# Patient Record
Sex: Male | Born: 1970
Health system: Southern US, Community
[De-identification: ages and names within clinical notes are randomized; demographics above are authoritative.]

## PROBLEM LIST (undated history)

## (undated) DIAGNOSIS — M25562 Pain in left knee: Secondary | ICD-10-CM

## (undated) DIAGNOSIS — Z86010 Personal history of colonic polyps: Secondary | ICD-10-CM

## (undated) DIAGNOSIS — E66812 Obesity, class 2: Secondary | ICD-10-CM

## (undated) DIAGNOSIS — T83420A Displacement of penile (implanted) prosthesis, initial encounter: Secondary | ICD-10-CM

## (undated) DIAGNOSIS — G4700 Insomnia, unspecified: Secondary | ICD-10-CM

## (undated) DIAGNOSIS — E669 Obesity, unspecified: Secondary | ICD-10-CM

## (undated) DIAGNOSIS — N200 Calculus of kidney: Secondary | ICD-10-CM

## (undated) DIAGNOSIS — K219 Gastro-esophageal reflux disease without esophagitis: Secondary | ICD-10-CM

## (undated) DIAGNOSIS — F17201 Nicotine dependence, unspecified, in remission: Secondary | ICD-10-CM

## (undated) HISTORY — DX: Obesity, unspecified: E66.9

## (undated) HISTORY — DX: Insomnia, unspecified: G47.00

## (undated) HISTORY — DX: Obesity, class 2: E66.812

## (undated) HISTORY — DX: Gastro-esophageal reflux disease without esophagitis: K21.9

## (undated) HISTORY — PX: CERVICAL FUSION: SHX112

## (undated) HISTORY — DX: Calculus of kidney: N20.0

## (undated) HISTORY — DX: Pain in left knee: M25.562

## (undated) HISTORY — DX: Nicotine dependence, unspecified, in remission: F17.201

---
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
## 1999-01-22 HISTORY — PX: RECONSTRUCTION MID-FACE: SUR1085

## 2010-01-21 HISTORY — PX: ESOPHAGOGASTRODUODENOSCOPY: SHX1529

## 2012-12-01 ENCOUNTER — Ambulatory Visit (INDEPENDENT_AMBULATORY_CARE_PROVIDER_SITE_OTHER): Payer: BC Managed Care – PPO | Admitting: Nurse Practitioner

## 2012-12-01 ENCOUNTER — Encounter: Payer: Self-pay | Admitting: Nurse Practitioner

## 2012-12-01 VITALS — BP 122/82 | HR 82 | Temp 98.1°F | Resp 12 | Wt 214.8 lb

## 2012-12-01 DIAGNOSIS — J069 Acute upper respiratory infection, unspecified: Secondary | ICD-10-CM

## 2012-12-01 DIAGNOSIS — K219 Gastro-esophageal reflux disease without esophagitis: Secondary | ICD-10-CM | POA: Insufficient documentation

## 2012-12-01 NOTE — Patient Instructions (Signed)
You have a cold that is causing nasal congestion, post-nasal drip & cough. Use Neilmed Sinus Rinse daily to clear sinuses. You should be feeling better in another week.  Upper Respiratory Infection, Adult An upper respiratory infection (URI) is also known as the common cold. It is often caused by a type of germ (virus). Colds are easily spread (contagious). You can pass it to others by kissing, coughing, sneezing, or drinking out of the same glass. Usually, you get better in 1 or 2 weeks.  HOME CARE   Only take medicine as told by your doctor.  Use a warm mist humidifier or breathe in steam from a hot shower.  Drink enough water and fluids to keep your pee (urine) clear or pale yellow.  Get plenty of rest.  Return to work when your temperature is back to normal or as told by your doctor. You may use a face mask and wash your hands to stop your cold from spreading. GET HELP RIGHT AWAY IF:   After the first few days, you feel you are getting worse.  You have questions about your medicine.  You have chills, shortness of breath, or brown or red spit (mucus).  You have yellow or brown snot (nasal discharge) or pain in the face, especially when you bend forward.  You have a fever, puffy (swollen) neck, pain when you swallow, or white spots in the back of your throat.  You have a bad headache, ear pain, sinus pain, or chest pain.  You have a high-pitched whistling sound when you breathe in and out (wheezing).  You have a lasting cough or cough up blood.  You have sore muscles or a stiff neck. MAKE SURE YOU:   Understand these instructions.  Will watch your condition.  Will get help right away if you are not doing well or get worse. Document Released: 06/26/2007 Document Revised: 04/01/2011 Document Reviewed: 05/14/2010 [facility] Patient Information 2014 [place], [place].
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
## 2012-12-10 ENCOUNTER — Encounter: Payer: BC Managed Care – PPO | Admitting: Family Medicine

## 2012-12-11 ENCOUNTER — Encounter: Payer: Self-pay | Admitting: Family Medicine

## 2012-12-11 ENCOUNTER — Other Ambulatory Visit: Payer: Self-pay | Admitting: Family Medicine

## 2012-12-11 ENCOUNTER — Ambulatory Visit (INDEPENDENT_AMBULATORY_CARE_PROVIDER_SITE_OTHER): Payer: BC Managed Care – PPO | Admitting: Family Medicine

## 2012-12-11 VITALS — BP 120/83 | HR 77 | Temp 98.5°F | Resp 18 | Ht 73.0 in | Wt 240.0 lb

## 2012-12-11 DIAGNOSIS — G4700 Insomnia, unspecified: Secondary | ICD-10-CM

## 2012-12-11 DIAGNOSIS — Z0000 Encounter for general adult medical examination without abnormal findings: Secondary | ICD-10-CM

## 2012-12-11 DIAGNOSIS — K219 Gastro-esophageal reflux disease without esophagitis: Secondary | ICD-10-CM

## 2012-12-11 DIAGNOSIS — J209 Acute bronchitis, unspecified: Secondary | ICD-10-CM

## 2012-12-11 DIAGNOSIS — J019 Acute sinusitis, unspecified: Secondary | ICD-10-CM

## 2012-12-11 LAB — CBC WITH DIFFERENTIAL/PLATELET
Basophils Absolute: 0 10*3/uL (ref 0.0–0.1)
Hemoglobin: 14.4 g/dL (ref 13.0–17.0)
Lymphocytes Relative: 28 % (ref 12–46)
Lymphs Abs: 1.9 10*3/uL (ref 0.7–4.0)
Monocytes Relative: 8 % (ref 3–12)
Neutro Abs: 4.2 10*3/uL (ref 1.7–7.7)
Neutrophils Relative %: 63 % (ref 43–77)
Platelets: 301 10*3/uL (ref 150–400)
RDW: 13.7 % (ref 11.5–15.5)
WBC: 6.7 10*3/uL (ref 4.0–10.5)

## 2012-12-11 LAB — LIPID PANEL
HDL: 28 mg/dL — ABNORMAL LOW (ref >39)
LDL Cholesterol: 90 mg/dL (ref 0–99)
Total CHOL/HDL Ratio: 5.5 Ratio

## 2012-12-11 LAB — COMPREHENSIVE METABOLIC PANEL
ALT: 22 U/L (ref 0–53)
AST: 17 U/L (ref 0–37)
Albumin: 4.3 g/dL (ref 3.5–5.2)
CO2: 30 mEq/L (ref 19–32)
Calcium: 9.3 mg/dL (ref 8.4–10.5)
Chloride: 102 mEq/L (ref 96–112)
Creat: 1.21 mg/dL (ref 0.50–1.35)
Potassium: 4.6 mEq/L (ref 3.5–5.3)
Sodium: 139 mEq/L (ref 135–145)
Total Protein: 7.2 g/dL (ref 6.0–8.3)

## 2012-12-11 LAB — TSH: TSH: 3.073 u[IU]/mL (ref 0.350–4.500)

## 2012-12-11 MED ORDER — AZITHROMYCIN 250 MG PO TABS: ORAL_TABLET | ORAL | Status: DC

## 2012-12-11 MED ORDER — TRAZODONE HCL 100 MG PO TABS: ORAL_TABLET | ORAL | Status: DC

## 2012-12-11 NOTE — Assessment & Plan Note (Signed)
Prolonged URI with mild bronchitis.  No sign of RAD. Will rx azithromycin x 5d.
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
## 2012-12-11 NOTE — Assessment & Plan Note (Signed)
Trial of trazodone 100mg , 1 tab qhs.  May increase to 200 mg qhs after a few days if needed. I recommended he stop his xanax that he's been taking.

## 2012-12-11 NOTE — Assessment & Plan Note (Signed)
He'll continue omeprazole 40mg  bid. He'll make routine f/u with his GI MD over the next couple of months.

## 2012-12-11 NOTE — Assessment & Plan Note (Signed)
Reviewed age and gender appropriate health maintenance issues (prudent diet, regular exercise, health risks of tobacco and excessive alcohol, use of seatbelts, fire alarms in home, use of sunscreen).  Also reviewed age and gender appropriate health screening as well as vaccine recommendations. He declined flu vaccine.   HP labs ordered today (med center [facility]). Discussed general TLC changes to try to lose 25 lbs over the next 1 year.

## 2012-12-30 ENCOUNTER — Encounter: Payer: Self-pay | Admitting: Family Medicine

## 2012-12-30 ENCOUNTER — Ambulatory Visit (INDEPENDENT_AMBULATORY_CARE_PROVIDER_SITE_OTHER): Payer: BC Managed Care – PPO | Admitting: Family Medicine

## 2012-12-30 VITALS — BP 128/88 | HR 83 | Temp 98.7°F | Resp 18 | Ht 73.0 in | Wt 240.0 lb

## 2012-12-30 DIAGNOSIS — G4700 Insomnia, unspecified: Secondary | ICD-10-CM

## 2012-12-30 DIAGNOSIS — J309 Allergic rhinitis, unspecified: Secondary | ICD-10-CM

## 2012-12-30 DIAGNOSIS — J300 Vasomotor rhinitis: Secondary | ICD-10-CM | POA: Insufficient documentation

## 2012-12-30 MED ORDER — FLUTICASONE PROPIONATE 50 MCG/ACT NA SUSP: NASAL | Status: DC

## 2012-12-30 MED ORDER — TEMAZEPAM 15 MG PO CAPS: ORAL_CAPSULE | ORAL | Status: DC

## 2012-12-30 NOTE — Assessment & Plan Note (Signed)
I'll try flonase at this time and he needs to take it every day. If this doesn't help then I'll try astepro. No sign of infection.

## 2012-12-30 NOTE — Progress Notes (Signed)
Pre visit review using our clinic review tool, if applicable. No additional management support is needed unless otherwise documented below in the visit note. 

## 2012-12-30 NOTE — Assessment & Plan Note (Signed)
Discussed sleep hygiene in depth today and strongly encouraged pt to start paying attention to this. He'll d/c any current sedative hypnotic sleep aids. I'll start restoril 15mg , 1-2 po qhs prn insomnia, #60, RF x 1. Therapeutic expectations and side effect profile of medication discussed today.  Patient's questions answered.

## 2012-12-30 NOTE — Progress Notes (Signed)
OFFICE NOTE  12/30/2012  CC:  Chief Complaint  Patient presents with  . Nasal Congestion    going on two months  . Insomnia     HPI: Patient is a 42 y.o. Caucasian male who is here for ongoing nasal/sinus congestion in night and mornings.  No coughing.  Occ PND but not much.  No nasal sprays.  No fevers.  NO face pain or upper teeth pain.  During the daytime he says he is fine.  No wheezing or SOB or chest tightness.  Z-pack in recent past no help.  Taking claritin D lately.  Sleeping problems: "I got 2 hours last night".  Onset of problem initiating and maintaining sleep approx 8-12 mo ago at least.  Denies mind racing.  No restlessness in legs.  Describes poor sleep hygiene.  Tosses and turns a lot.  Trazodone 100mg  no help.  Ambien in the past helped but was giving him nightmares so he stopped it.  He has admitted to "getting" xanax (not from an MD) and trying it but it hasn't done much to help either.  Pertinent PMH:  Insomnia GERD  MEDS:  Omeprazole 40mg  qd  PE: Blood pressure 128/88, pulse 83, temperature 98.7 F (37.1 C), temperature source Temporal, resp. rate 18, height 6\' 1"  (1.854 m), weight 240 lb (108.863 kg), SpO2 96.00%. Gen: Alert, well appearing.  Patient is oriented to person, place, time, and situation. ZOX:WRUE: no injection, icteris, swelling, or exudate.  EOMI, PERRLA. Mouth: lips without lesion/swelling.  Oral mucosa pink and moist. Oropharynx without erythema, exudate, or swelling.  Neck - No masses or thyromegaly or limitation in range of motion CV: RRR, no m/r/g.   LUNGS: CTA bilat, nonlabored resps, good aeration in all lung fields.   IMPRESSION AND PLAN:  Insomnia Discussed sleep hygiene in depth today and strongly encouraged pt to start paying attention to this. He'll d/c any current sedative hypnotic sleep aids. I'll start restoril 15mg , 1-2 po qhs prn insomnia, #60, RF x 1. Therapeutic expectations and side effect profile of medication  discussed today.  Patient's questions answered.   Vasomotor rhinitis I'll try flonase at this time and he needs to take it every day. If this doesn't help then I'll try astepro. No sign of infection.  I recommended he d/c claritin D.  An After Visit Summary was printed and given to the patient.  FOLLOW UP: 1 mo

## 2013-01-15 ENCOUNTER — Ambulatory Visit: Payer: BC Managed Care – PPO | Admitting: Family Medicine

## 2013-01-27 ENCOUNTER — Ambulatory Visit: Payer: BC Managed Care – PPO | Admitting: Family Medicine

## 2013-02-04 ENCOUNTER — Ambulatory Visit (INDEPENDENT_AMBULATORY_CARE_PROVIDER_SITE_OTHER): Payer: BC Managed Care – PPO | Admitting: Family Medicine

## 2013-02-04 ENCOUNTER — Encounter: Payer: Self-pay | Admitting: Family Medicine

## 2013-02-04 VITALS — BP 135/94 | HR 77 | Temp 98.7°F | Resp 18 | Ht 73.0 in | Wt 242.0 lb

## 2013-02-04 DIAGNOSIS — J309 Allergic rhinitis, unspecified: Secondary | ICD-10-CM

## 2013-02-04 DIAGNOSIS — G4700 Insomnia, unspecified: Secondary | ICD-10-CM

## 2013-02-04 DIAGNOSIS — Z23 Encounter for immunization: Secondary | ICD-10-CM

## 2013-02-04 DIAGNOSIS — J300 Vasomotor rhinitis: Secondary | ICD-10-CM

## 2013-02-04 MED ORDER — TEMAZEPAM 15 MG PO CAPS: ORAL_CAPSULE | ORAL | Status: DC

## 2013-02-04 NOTE — Progress Notes (Signed)
OFFICE NOTE  02/04/2013  CC:  Chief Complaint  Patient presents with  . Follow-up     HPI: Patient is a 43 y.o. Caucasian male who is here for 1 mo f/u insomnia and nasal congestion. Nasal congestion cleared up with about 1 wk use of flonase that I rx'd last visit.  His sleep initiation and maintenance is much improved on the restoril.  He takes 2 of the 15mg  tabs every night and also 2 aleve PM every night.  Also his not having anymore of the bad dreams like before.  No new complaints. Pertinent PMH:  Past Medical History  Diagnosis Date  . GERD (gastroesophageal reflux disease)     [facility] [place]  . Tobacco dependence in remission     Quit 2005  . Insomnia   . Nephrolithiasis     remote past (no procedure has been required to remove stone)    MEDS:  Outpatient Prescriptions Prior to Visit  Medication Sig Dispense Refill  . omeprazole (PRILOSEC) 40 MG capsule Take 40 mg by mouth 2 (two) times daily.      Marland Kitchen. terbinafine (LAMISIL) 250 MG tablet Take 250 mg by mouth daily.      . temazepam (RESTORIL) 15 MG capsule 1-2 caps po qhs prn insomnia  60 capsule  1  . fluticasone (FLONASE) 50 MCG/ACT nasal spray 2 sprays in each nostril once every morning  16 g  6  . traZODone (DESYREL) 100 MG tablet 1-2 tabs po qhs prn insomnia  60 tablet  1   No facility-administered medications prior to visit.    PE: Blood pressure 135/94, pulse 77, temperature 98.7 F (37.1 C), temperature source Temporal, resp. rate 18, height 6\' 1"  (1.854 m), weight 242 lb (109.77 kg), SpO2 96.00%. Gen: Alert, well appearing.  Patient is oriented to person, place, time, and situation. AFFECT: pleasant, lucid thought and speech. No further exam today.  IMPRESSION AND PLAN:  Insomnia The current medical regimen is effective;  continue present plan and medications. Rx for restoril 15mg , #60, RF x 5. Controlled substance contract reviewed with patient today.  Patient signed this and it  will be placed in the chart.   No UDS was done today.   Vasomotor rhinitis This appears to have cleared up nicely with just a week or so of flonase. I suppose this was more inflammatory than vasomotor.   Preventative health: gave Tdap today. He declined flu vaccine.  An After Visit Summary was printed and given to the patient.  FOLLOW UP: 18mo

## 2013-02-04 NOTE — Assessment & Plan Note (Signed)
This appears to have cleared up nicely with just a week or so of flonase. I suppose this was more inflammatory than vasomotor.

## 2013-02-04 NOTE — Assessment & Plan Note (Signed)
The current medical regimen is effective;  continue present plan and medications. Rx for restoril 15mg , #60, RF x 5. Controlled substance contract reviewed with patient today.  Patient signed this and it will be placed in the chart.   No UDS was done today.

## 2013-02-16 ENCOUNTER — Telehealth: Payer: Self-pay | Admitting: Family Medicine

## 2013-02-17 MED ORDER — OMEPRAZOLE 40 MG PO CPDR: 40.0000 mg | DELAYED_RELEASE_CAPSULE | Freq: Two times a day (BID) | ORAL | Status: DC

## 2013-02-17 NOTE — Telephone Encounter (Signed)
Omeprazole sent to pharmacy per [facility] protocol and patient request.

## 2013-06-10 ENCOUNTER — Other Ambulatory Visit: Payer: Self-pay | Admitting: Family Medicine

## 2013-08-16 ENCOUNTER — Other Ambulatory Visit: Payer: Self-pay | Admitting: Family Medicine

## 2013-08-16 NOTE — Telephone Encounter (Signed)
Rx refill request for temazepam.  Patient last seen 02/04/13. Last Rx filled 02/04/13 x 5 rfs.  Please advise.

## 2013-09-01 ENCOUNTER — Ambulatory Visit: Payer: BC Managed Care – PPO | Admitting: Family Medicine

## 2013-09-08 ENCOUNTER — Other Ambulatory Visit: Payer: Self-pay | Admitting: Hematology and Oncology

## 2013-10-11 ENCOUNTER — Other Ambulatory Visit: Payer: Self-pay | Admitting: Family Medicine

## 2013-10-15 ENCOUNTER — Ambulatory Visit (INDEPENDENT_AMBULATORY_CARE_PROVIDER_SITE_OTHER): Payer: BC Managed Care – PPO | Admitting: Family Medicine

## 2013-10-15 ENCOUNTER — Encounter: Payer: Self-pay | Admitting: Family Medicine

## 2013-10-15 VITALS — BP 132/84 | HR 66 | Temp 97.7°F | Resp 18 | Ht 73.0 in | Wt 239.0 lb

## 2013-10-15 DIAGNOSIS — K219 Gastro-esophageal reflux disease without esophagitis: Secondary | ICD-10-CM

## 2013-10-15 DIAGNOSIS — G4700 Insomnia, unspecified: Secondary | ICD-10-CM

## 2013-10-15 MED ORDER — OMEPRAZOLE 40 MG PO CPDR: DELAYED_RELEASE_CAPSULE | ORAL | Status: DC

## 2013-10-15 NOTE — Progress Notes (Signed)
Pre visit review using our clinic review tool, if applicable. No additional management support is needed unless otherwise documented below in the visit note. 

## 2013-10-15 NOTE — Progress Notes (Signed)
OFFICE NOTE  10/15/2013  CC:  Chief Complaint  Patient presents with  . Follow-up   HPI: Patient is a 43 y.o. Caucasian male who is here for 9 mo f/u insomnia.  Must take prilosec  bid in order to keep GERD sx's away.  He does not avoid GER-inducing food but has to avoid alcohol.    Takes restoril  2 tabs qhs for insomnia every night.  Pertinent PMH:  Past medical, surgical, social, and family history reviewed and no changes are noted since last office visit.  MEDS:  Outpatient Prescriptions Prior to Visit  Medication Sig Dispense Refill  . fluticasone (FLONASE) 50 MCG/ACT nasal spray 2 sprays in each nostril once every morning  16 g  6  . omeprazole (PRILOSEC) 40 MG capsule TAKE 1 CAPSULE (40 MG) BY MOUTH 2 (TWO) TIMES DAILY.  60 capsule  3  . temazepam (RESTORIL) 15 MG capsule TAKE 1-2 CAPSULES BY MOUTH AT BEDTIME AS NEEDED FOR INSOMNIA  60 capsule  5  . terbinafine (LAMISIL) 250 MG tablet Take 250 mg by mouth daily.       No facility-administered medications prior to visit.    PE: Blood pressure 132/84, pulse 66, temperature 97.7 F (36.5 C), temperature source Oral, resp. rate 18, height  (1.854 m), weight 239 lb (108.41 kg), SpO2 97.00%. Gen: Alert, well appearing.  Patient is oriented to person, place, time, and situation. AFFECT: pleasant, lucid thought and speech. No further exam today.  IMPRESSION AND PLAN:  1) GERD, chronic and severe. Needs bid omeprazole .  Needs another EGD for screening for Barrett's esophagus in a couple of years.  Pt expressed understanding of need for this, also understands potential risks of use of PPI long term.  2) Insomnia: responding well to  restoril qhs.  Continue this.  No new rx needed today.  An After Visit Summary was printed and given to the patient.  Pt declined flu vaccine today.  FOLLOW UP: 6 mo for fasting CPE

## 2013-11-29 ENCOUNTER — Encounter: Payer: Self-pay | Admitting: Nurse Practitioner

## 2013-11-29 ENCOUNTER — Ambulatory Visit (INDEPENDENT_AMBULATORY_CARE_PROVIDER_SITE_OTHER): Payer: BC Managed Care – PPO | Admitting: Nurse Practitioner

## 2013-11-29 VITALS — BP 137/94 | HR 68 | Temp 98.4°F | Resp 18 | Ht 73.0 in | Wt 236.0 lb

## 2013-11-29 DIAGNOSIS — J029 Acute pharyngitis, unspecified: Secondary | ICD-10-CM

## 2013-11-29 DIAGNOSIS — R059 Cough, unspecified: Secondary | ICD-10-CM

## 2013-11-29 DIAGNOSIS — R05 Cough: Secondary | ICD-10-CM

## 2013-11-29 LAB — POCT RAPID STREP A (OFFICE): Rapid Strep A Screen: NEGATIVE

## 2013-11-29 MED ORDER — BENZONATATE 100 MG PO CAPS: ORAL_CAPSULE | ORAL | Status: DC

## 2013-11-29 NOTE — Patient Instructions (Signed)
This is likely a viral sore throat/upper respiratory infection. However, if your culture comes back growing bacteria, I will call in an antibiotic. In the meantime, you may use benzocaine throat lozenges or throat spray for comfort. Salt water gargles (1/4 cup warm water mixed with 1/4 tsp salt) twice daily & listerene gargles twice daily. If you develop runny nose, start Neilmed sinus rinses daily.  Rest, sip fluids every hour.  Feel better!  Sore Throat A sore throat is pain, burning, irritation, or scratchiness of the throat. There is often pain or tenderness when swallowing or talking. A sore throat may be accompanied by other symptoms, such as coughing, sneezing, fever, and swollen neck glands. A sore throat is often the first sign of another sickness, such as a cold, flu, strep throat, or mononucleosis (commonly known as mono). Most sore throats go away without medical treatment. CAUSES  The most common causes of a sore throat include:  A viral infection, such as a cold, flu, or mono.  A bacterial infection, such as strep throat, tonsillitis, or whooping cough.  Seasonal allergies.  Dryness in the air.  Irritants, such as smoke or pollution.  Gastroesophageal reflux disease (GERD). HOME CARE INSTRUCTIONS   Only take over-the-counter medicines as directed by your caregiver.  Drink enough fluids to keep your urine clear or pale yellow.  Rest as needed.  Try using throat sprays, lozenges, or sucking on hard candy to ease any pain (if older than 4 years or as directed).  Sip warm liquids, such as broth, herbal tea, or warm water with honey to relieve pain temporarily. You may also eat or drink cold or frozen liquids such as frozen ice pops.  Gargle with salt water (mix 1 tsp salt with 8 oz of water).  Do not smoke and avoid secondhand smoke.  Put a cool-mist humidifier in your bedroom at night to moisten the air. You can also turn on a hot shower and sit in the bathroom with the  door closed for 5 10 minutes. SEEK IMMEDIATE MEDICAL CARE IF:  You have difficulty breathing.  You are unable to swallow fluids, soft foods, or your saliva.  You have increased swelling in the throat.  Your sore throat does not get better in 7 days.  You have nausea and vomiting.  You have a fever or persistent symptoms for more than 2 3 days.  You have a fever and your symptoms suddenly get worse. MAKE SURE YOU:   Understand these instructions.  Will watch your condition.  Will get help right away if you are not doing well or get worse. Document Released: 02/15/2004 Document Revised: 12/25/2011 Document Reviewed: 09/15/2011 [employer] Patient Information 2014 [employer]. 

## 2013-11-29 NOTE — Progress Notes (Signed)
   Subjective:    Patient ID: Spencer Harrington, male    DOB: 1970-05-31, 43 y.o.   MRN: [id number]  Cough This is a new problem. The current episode started yesterday. The problem occurs hourly. The cough is non-productive. Associated symptoms include headaches and a sore throat. Pertinent negatives include no chest pain, chills, ear congestion, ear pain, fever, myalgias, nasal congestion, postnasal drip, shortness of breath or wheezing. Nothing aggravates the symptoms. He has tried nothing for the symptoms.      Review of Systems  Constitutional: Negative for fever, chills, activity change, appetite change and fatigue.  HENT: Positive for sore throat. Negative for congestion, ear pain and postnasal drip.   Respiratory: Negative for chest tightness, shortness of breath and wheezing.   Cardiovascular: Negative for chest pain.  Gastrointestinal: Negative for nausea, abdominal pain and diarrhea.  Musculoskeletal: Negative for myalgias and back pain.  Neurological: Positive for headaches.       Objective:   Physical Exam  Constitutional: He is oriented to person, place, and time. He appears well-developed and well-nourished. No distress.  HENT:  Head: Normocephalic and atraumatic.  Right Ear: External ear normal.  Left Ear: External ear normal.  Ceruminosis L TM R TM nml Posterior pharynx erythematous. Tonsillith L tonsil.   Eyes: Conjunctivae are normal. Right eye exhibits no discharge. Left eye exhibits no discharge.  Cardiovascular: Normal rate, regular rhythm and normal heart sounds.   No murmur heard. Pulmonary/Chest: Effort normal and breath sounds normal. No respiratory distress. He has no wheezes. He has no rales.  Neurological: He is alert and oriented to person, place, and time.  Skin: Skin is warm and dry.  Psychiatric: He has a normal mood and affect. His behavior is normal. Thought content normal.  Vitals reviewed.         Assessment & Plan:  1. Sore  throat Likely viral - POCT rapid strep A-neg -benzonatate for cough See patient instructions for complete plan. F/u prn

## 2013-11-29 NOTE — Progress Notes (Signed)
Pre visit review using our clinic review tool, if applicable. No additional management support is needed unless otherwise documented below in the visit note. 

## 2013-12-01 ENCOUNTER — Telehealth: Payer: Self-pay | Admitting: Nurse Practitioner

## 2013-12-01 NOTE — Telephone Encounter (Signed)
pls call pt: Advise Viral sore throat. Continue w/ofc instructions.

## 2013-12-01 NOTE — Telephone Encounter (Signed)
Patient notified of results.

## 2013-12-02 LAB — CULTURE, UPPER RESPIRATORY: Organism ID, Bacteria: NORMAL

## 2014-02-18 ENCOUNTER — Other Ambulatory Visit: Payer: Self-pay | Admitting: Family Medicine

## 2014-02-18 NOTE — Telephone Encounter (Signed)
RF for temazepam.  Last OV was 10/15/13.  Last RX was 08/16/13 x 5 rfs.  Please advise.

## 2014-02-21 ENCOUNTER — Other Ambulatory Visit: Payer: Self-pay | Admitting: Family Medicine

## 2014-02-28 ENCOUNTER — Ambulatory Visit (INDEPENDENT_AMBULATORY_CARE_PROVIDER_SITE_OTHER): Payer: No Typology Code available for payment source | Admitting: Family Medicine

## 2014-02-28 ENCOUNTER — Encounter: Payer: Self-pay | Admitting: Family Medicine

## 2014-02-28 VITALS — BP 159/122 | HR 79 | Temp 98.6°F | Resp 18 | Ht 73.0 in | Wt 238.0 lb

## 2014-02-28 DIAGNOSIS — M5030 Other cervical disc degeneration, unspecified cervical region: Secondary | ICD-10-CM

## 2014-02-28 DIAGNOSIS — M542 Cervicalgia: Secondary | ICD-10-CM

## 2014-02-28 DIAGNOSIS — M62838 Other muscle spasm: Secondary | ICD-10-CM

## 2014-02-28 DIAGNOSIS — M6248 Contracture of muscle, other site: Secondary | ICD-10-CM

## 2014-02-28 MED ORDER — CYCLOBENZAPRINE HCL 10 MG PO TABS: 10.0000 mg | ORAL_TABLET | Freq: Three times a day (TID) | ORAL | Status: DC | PRN

## 2014-02-28 MED ORDER — OXYCODONE-ACETAMINOPHEN 5-325 MG PO TABS: 1.0000 | ORAL_TABLET | Freq: Four times a day (QID) | ORAL | Status: DC | PRN
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
## 2014-02-28 NOTE — Progress Notes (Signed)
Pre visit review using our clinic review tool, if applicable. No additional management support is needed unless otherwise documented below in the visit note. 

## 2014-03-04 ENCOUNTER — Telehealth: Payer: Self-pay | Admitting: Family Medicine

## 2014-03-04 DIAGNOSIS — M542 Cervicalgia: Secondary | ICD-10-CM

## 2014-03-04 NOTE — Telephone Encounter (Signed)
Patient called back, was able to get an appointment with [facility] for Monday 03/07/14. I also found out it he went to the [facility] in [place], not an Urgent Care.

## 2014-03-04 NOTE — Telephone Encounter (Signed)
Please advise referral?  

## 2014-03-04 NOTE — Telephone Encounter (Signed)
Patient was in so much pain he went to Urgent Care. They did MRI & found a bone spur in his neck. They recommended surgery. Patient needs referral, he is not requesting a particular practice. Please put in a referral.

## 2014-03-05 NOTE — Telephone Encounter (Signed)
Referral ordered as per pt request. Pls get his records (or ask Diane to) from [facility] ED in [place]-thx

## 2014-03-07 NOTE — Telephone Encounter (Signed)
Fax sent requesting medical records.

## 2014-03-30 MED ORDER — PANTOPRAZOLE SODIUM 40 MG PO TBEC: 40.0000 mg | DELAYED_RELEASE_TABLET | Freq: Every day | ORAL | Status: DC

## 2014-03-30 NOTE — Telephone Encounter (Signed)
Patient needs Rx to be sent to [facility] in [place]

## 2014-03-30 NOTE — Telephone Encounter (Signed)
Patient notified

## 2014-03-30 NOTE — Telephone Encounter (Signed)
Please advise med change for Prilosec?

## 2014-03-30 NOTE — Telephone Encounter (Signed)
I sent in eRx for pantoprazole 40mg  once a day. Make sure patient knows that if he is not improved on this med then he should make a follow up appointment with his GI MD ([facility]).-thx

## 2014-03-30 NOTE — Telephone Encounter (Signed)
Patient feels like he has become immune to current heartburn medication, he is requesting something stronger. He has been taking Prisolesec for 10 years.  Patient is still tossing & turning 10 times a night. He would like to try 30 day Rx of something stronger.

## 2014-04-01 ENCOUNTER — Telehealth: Payer: Self-pay | Admitting: Family Medicine

## 2014-04-01 MED ORDER — PANTOPRAZOLE SODIUM 40 MG PO TBEC: 40.0000 mg | DELAYED_RELEASE_TABLET | Freq: Every day | ORAL | Status: DC

## 2014-04-01 NOTE — Telephone Encounter (Signed)
Pt walked into office today and stated his protonix was sent into wrong pharmacy.  ( He was slurring his words and very irritable. )  I sent Rx into [facility] in [place] as requested.     Patient also wants to change his sleep medication and was extremely unhappy that medication wasn't changed per request earlier.  Please advise if you will change this Rx.

## 2014-04-05 MED ORDER — NORTRIPTYLINE HCL 25 MG PO CAPS: ORAL_CAPSULE | ORAL | Status: DC

## 2014-04-05 NOTE — Telephone Encounter (Signed)
Patient aware.

## 2014-04-05 NOTE — Telephone Encounter (Signed)
I sent in nortriptyline to patient's pharmacy: this is his new sleep med.  Tell him to make sure he does not take restoril or any other sleep medicine WITH this new sleep med.-thx

## 2014-04-05 NOTE — Telephone Encounter (Signed)
Can you please decide if you want pt to change sleep med?  Or do you want him to be seen?  Please advise.

## 2014-04-28 ENCOUNTER — Telehealth: Payer: Self-pay | Admitting: Family Medicine

## 2014-04-28 MED ORDER — ESZOPICLONE 1 MG PO TABS: ORAL_TABLET | ORAL | Status: DC

## 2014-04-28 NOTE — Telephone Encounter (Signed)
Patient came into office today stating that the nortriptyline ( 4 pills at bedtime ) seemed to help some but he is still tossing and turning and it gave him very bad dry mouth.  Also, pantoprazole isn't working.  Pt states he went back to prilosec but he is taking it TID instead of BID and it does seem to work well that way.    Please advise if you can change his sleep medication and also Rx his prilosec TID.

## 2014-04-28 NOTE — Telephone Encounter (Signed)
Patient aware.  Rx faxed.  

## 2014-04-28 NOTE — Telephone Encounter (Signed)
Will rx lunesta to see if this helps with sleep. When he takes this, I want him to cut back on his nortriptyline to 2 tabs at bedtime maximum. Also, I will not rx omeprazole to be dosed three times per day.  I will write rx to take it two times per day, and he can add a 150mg  dose of OTC generic zantac at bedtime.  If he is not satisfied with this plan, then he needs to return to see his GI MD ([facility]) to get rechecked.   Let me know, and then I'll send in rx's as necessary.-thx

## 2014-04-28 NOTE — Telephone Encounter (Signed)
Patent seemed okay with plan.  Please send Rx for lunesta.

## 2014-04-28 NOTE — Telephone Encounter (Signed)
OK.  Tell pt lunesta rx will say "1-3 tabs at bedtime as needed for insomnia". Tell him I want him to start with 1 tab at bedtime, and he may increase by 1 tab every 3 days to a max of 3 tabs at bedtime.  If this helps and he wants to stay on this med, tell him to call when he is almost through with this rx and tell how many pills he takes each night and I'll send in approp rx with additional RFs.-thx

## 2014-06-10 ENCOUNTER — Telehealth: Payer: Self-pay | Admitting: Family Medicine

## 2014-06-10 MED ORDER — ZALEPLON 10 MG PO CAPS: 10.0000 mg | ORAL_CAPSULE | Freq: Every evening | ORAL | Status: DC | PRN

## 2014-06-10 MED ORDER — OMEPRAZOLE 40 MG PO CPDR: DELAYED_RELEASE_CAPSULE | ORAL | Status: DC

## 2014-06-10 NOTE — Telephone Encounter (Signed)
Patient is having trouble with Lunesta, it is leaving a bad taste in his mouth. Can he get a different Rx?  Patient mentioned that the pharmacy has a medicine for heartburn on file that does not work. Please note in patient's chart that he wants to take prilosec 40 MG 2 times a day instead of the recent Rx for heartburn, patient didn't know the name of Rx.

## 2014-06-10 NOTE — Telephone Encounter (Signed)
See note below

## 2014-06-10 NOTE — Telephone Encounter (Signed)
OK, Stop lunesta.   I printed sonata rx to take for insomnia. Continue nortriptyline 25mg , 2 tabs at bedtime.  Also, please check to see if he is still taking restoril (temazepam) at bedtime or can I take this off his med list?  Pls fax to his pharmacy.-thx

## 2014-06-10 NOTE — Telephone Encounter (Signed)
Pt advised and voiced understanding. He stated that he is no longer taking the temazepam. Rx was faxed to his pharmacy.

## 2014-06-14 ENCOUNTER — Other Ambulatory Visit: Payer: Self-pay | Admitting: Family Medicine

## 2014-06-14 NOTE — Telephone Encounter (Signed)
Noted  

## 2014-11-23 ENCOUNTER — Other Ambulatory Visit: Payer: Self-pay | Admitting: Family Medicine

## 2014-11-23 NOTE — Telephone Encounter (Signed)
RF request for zaleplon LOV: 02/28/14 Next ov: None Last written: 06/10/14 #30 w/ 5RF

## 2014-11-24 NOTE — Telephone Encounter (Signed)
Rx faxed

## 2014-12-02 ENCOUNTER — Telehealth: Payer: Self-pay | Admitting: *Deleted

## 2014-12-02 MED ORDER — OMEPRAZOLE 40 MG PO CPDR: DELAYED_RELEASE_CAPSULE | ORAL | Status: DC

## 2014-12-02 NOTE — Telephone Encounter (Signed)
Pt advised and voiced understanding.  Apt made for 12/02/14 at 3:45pm.

## 2014-12-02 NOTE — Telephone Encounter (Signed)
RF request for omeprazole LOV: 10/15/13 Next ov: None Last written: 06/10/14 #180 w/ 3RF  Pt needs ov for more refills.

## 2014-12-08 ENCOUNTER — Ambulatory Visit: Payer: No Typology Code available for payment source | Admitting: Family Medicine

## 2014-12-08 DIAGNOSIS — Z0289 Encounter for other administrative examinations: Secondary | ICD-10-CM

## 2015-01-03 ENCOUNTER — Ambulatory Visit (HOSPITAL_BASED_OUTPATIENT_CLINIC_OR_DEPARTMENT_OTHER): Payer: No Typology Code available for payment source | Attending: Otolaryngology | Admitting: Radiology

## 2015-01-03 VITALS — Ht 73.0 in | Wt 240.0 lb

## 2015-01-03 DIAGNOSIS — G4733 Obstructive sleep apnea (adult) (pediatric): Secondary | ICD-10-CM | POA: Diagnosis not present

## 2015-01-03 DIAGNOSIS — I493 Ventricular premature depolarization: Secondary | ICD-10-CM | POA: Diagnosis not present

## 2015-01-03 DIAGNOSIS — R0683 Snoring: Secondary | ICD-10-CM | POA: Diagnosis not present

## 2015-01-21 DIAGNOSIS — G4733 Obstructive sleep apnea (adult) (pediatric): Secondary | ICD-10-CM | POA: Diagnosis not present

## 2015-01-21 NOTE — Progress Notes (Signed)
   Patient Spencer Harrington DOB 17-Sep-2070 MRN [id number] Study date 01/03/15 CLINICAL INFORMATION Sleep Study Type: NPSG Indication for sleep study: Snoring Epworth Sleepiness Score: 3  SLEEP STUDY TECHNIQUE As per the AASM Manual for the Scoring of Sleep and Associated Events v2.3 (April 2016) with a hypopnea requiring 4% desaturations. The channels recorded and monitored were frontal, central and occipital EEG, electrooculogram (EOG), submentalis EMG (chin), nasal and oral airflow, thoracic and abdominal wall motion, anterior tibialis EMG, snore microphone, electrocardiogram, and pulse oximetry.  MEDICATIONS Patient's medications include: charted for review Medications self-administered by patient during sleep study : No sleep medicine administered.  SLEEP ARCHITECTURE The study was initiated at 10:51:58 PM and ended at 5:17:59 AM. Sleep onset time was 24.4 minutes and the sleep efficiency was 88.6%. The total sleep time was 342.0 minutes. Stage REM latency was 257.0 minutes. The patient spent 16.67% of the night in stage N1 sleep, 76.90% in stage N2 sleep, 0.00% in stage N3 and 6.43% in REM. Alpha intrusion was absent. Supine sleep was 0.00%.  RESPIRATORY PARAMETERS The overall apnea/hypopnea index (AHI) was 7.9 per hour. There were 21 total apneas, including 21 obstructive, 0 central and 0 mixed apneas. There were 24 hypopneas and 148 RERAs. The AHI during Stage REM sleep was 35.5 per hour. AHI while supine was N/A per hour. The mean oxygen saturation was 92.86%. The minimum SpO2 during sleep was 81.00%. Moderate snoring was noted during this study.  CARDIAC DATA The 2 lead EKG demonstrated sinus rhythm. The mean heart rate was 68.91 beats per minute. Other EKG findings include: PVCs.  LEG MOVEMENT DATA The total PLMS were 19 with a resulting PLMS index of 3.33. Associated arousal with leg movement index was 0.0 .  IMPRESSIONS - Mild obstructive sleep apnea occurred  during this study (AHI = 7.9/h). - No significant central sleep apnea occurred during this study (CAI = 0.0/h). - Mild oxygen desaturation was noted during this study (Min O2 = 81.00%). - The patient snored with Moderate snoring volume. - EKG findings include PVCs. - Clinically significant periodic limb movements did not occur during sleep. No significant associated arousals.  DIAGNOSIS - Obstructive Sleep Apnea (327.23 [G47.33 ICD-10])  RECOMMENDATIONS - Very mild obstructive sleep apnea. Return to discuss treatment options. - Avoid alcohol, sedatives and other CNS depressants that may worsen sleep apnea and disrupt normal sleep architecture. - Sleep hygiene should be reviewed to assess factors that may improve sleep quality. - Weight management and regular exercise should be initiated or continued if appropriate.  Waymon BudgeYOUNG,Spencer Harrington D Diplomate, American Board of Sleep Medicine  ELECTRONICALLY SIGNED ON:  01/21/2015, 4:27 PM [facility] PH: [phone]   FX: [phone] ACCREDITED BY THE AMERICAN ACADEMY OF SLEEP MEDICINE

## 2015-03-08 ENCOUNTER — Encounter: Payer: Self-pay | Admitting: Family Medicine

## 2015-03-08 ENCOUNTER — Ambulatory Visit (INDEPENDENT_AMBULATORY_CARE_PROVIDER_SITE_OTHER): Payer: BLUE CROSS/BLUE SHIELD | Admitting: Family Medicine

## 2015-03-08 VITALS — BP 135/90 | HR 86 | Temp 98.3°F | Resp 16 | Ht 73.0 in | Wt 251.0 lb

## 2015-03-08 DIAGNOSIS — G4700 Insomnia, unspecified: Secondary | ICD-10-CM | POA: Diagnosis not present

## 2015-03-08 DIAGNOSIS — R51 Headache: Secondary | ICD-10-CM | POA: Diagnosis not present

## 2015-03-08 DIAGNOSIS — K219 Gastro-esophageal reflux disease without esophagitis: Secondary | ICD-10-CM | POA: Diagnosis not present

## 2015-03-08 DIAGNOSIS — G4733 Obstructive sleep apnea (adult) (pediatric): Secondary | ICD-10-CM

## 2015-03-08 DIAGNOSIS — R519 Headache, unspecified: Secondary | ICD-10-CM

## 2015-03-08 MED ORDER — OMEPRAZOLE 40 MG PO CPDR: DELAYED_RELEASE_CAPSULE | ORAL | Status: DC
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
## 2015-03-08 NOTE — Progress Notes (Signed)
Pre visit review using our clinic review tool, if applicable. No additional management support is needed unless otherwise documented below in the visit note. 

## 2015-03-23 ENCOUNTER — Ambulatory Visit (HOSPITAL_BASED_OUTPATIENT_CLINIC_OR_DEPARTMENT_OTHER): Payer: BLUE CROSS/BLUE SHIELD | Attending: Otolaryngology | Admitting: Radiology

## 2015-03-23 VITALS — Ht 73.0 in | Wt 240.0 lb

## 2015-03-23 DIAGNOSIS — G4730 Sleep apnea, unspecified: Secondary | ICD-10-CM | POA: Diagnosis present

## 2015-03-23 DIAGNOSIS — G4733 Obstructive sleep apnea (adult) (pediatric): Secondary | ICD-10-CM | POA: Diagnosis not present

## 2015-03-26 DIAGNOSIS — G4733 Obstructive sleep apnea (adult) (pediatric): Secondary | ICD-10-CM | POA: Diagnosis not present

## 2015-03-26 NOTE — Progress Notes (Signed)
  Patient Name: Spencer Harrington, Edris Study Date: 03/23/2015 Gender: Male D.O.B: [birth year] Age (years): 45 Referring Provider: Serena ColonelJefry Rosen Height (inches): 73 Interpreting Physician: Jetty Duhamellinton Young MD, ABSM Weight (lbs): 240 RPSGT: Shelah LewandowskyGregory, Kenyon BMI: 32 MRN: [id number] Neck Size: 17.00 CLINICAL INFORMATION The patient is referred for a CPAP titration to treat sleep apnea.   Date of NPSG, Split Night or HST:  Diagnostic NPSG 01/03/15, AHI 7.9/ hr, body weight 240 lbs  SLEEP STUDY TECHNIQUE As per the AASM Manual for the Scoring of Sleep and Associated Events v2.3 (April 2016) with a hypopnea requiring 4% desaturations. The channels recorded and monitored were frontal, central and occipital EEG, electrooculogram (EOG), submentalis EMG (chin), nasal and oral airflow, thoracic and abdominal wall motion, anterior tibialis EMG, snore microphone, electrocardiogram, and pulse oximetry. Continuous positive airway pressure (CPAP) was initiated at the beginning of the study and titrated to treat sleep-disordered breathing.  MEDICATIONS Medications taken by the patient : charted for review Medications administered by patient during sleep study : No sleep medicine administered.  TECHNICIAN COMMENTS Comments added by technician: NONE  Comments added by scorer: N/A  RESPIRATORY PARAMETERS Optimal PAP Pressure (cm): 15 AHI at Optimal Pressure (/hr): 0.4 Overall Minimal O2 (%): 86.00 Supine % at Optimal Pressure (%): 0 Minimal O2 at Optimal Pressure (%): 92.0    SLEEP ARCHITECTURE The study was initiated at 10:30:59 PM and ended at 4:42:08 AM. Sleep onset time was 13.8 minutes and the sleep efficiency was 88.2%. The total sleep time was 327.5 minutes. The patient spent 10.84% of the night in stage N1 sleep, 45.34% in stage N2 sleep, 0.00% in stage N3 and 43.82% in REM.Stage REM latency was 48.0 minutes Wake after sleep onset was 29.9. Alpha intrusion was absent. Supine sleep was  3.82%.  CARDIAC DATA The 2 lead EKG demonstrated sinus rhythm. The mean heart rate was 70.34 beats per minute. Other EKG findings include: None.  LEG MOVEMENT DATA The total Periodic Limb Movements of Sleep (PLMS) were 0. The PLMS index was 0.00. A PLMS index of <15 is considered normal in adults.  IMPRESSIONS - The optimal PAP pressure was 15 cm of water. - Central sleep apnea was not noted during this titration (CAI = 0.4/h). - Moderate oxygen desaturations were observed during this titration (min O2 = 86.00%). - No snoring was audible during this study. - No cardiac abnormalities were observed during this study. - Clinically significant periodic limb movements were not noted during this study. Arousals associated with PLMs were rare.  DIAGNOSIS - Obstructive Sleep Apnea (327.23 [G47.33 ICD-10])  RECOMMENDATIONS - Trial of CPAP therapy on 15 cm H2O with a Small size Fisher&Paykel Full Face Mask Simplus mask and heated humidification. - Avoid alcohol, sedatives and other CNS depressants that may worsen sleep apnea and disrupt normal sleep architecture. - Sleep hygiene should be reviewed to assess factors that may improve sleep quality. - Weight management and regular exercise should be initiated or continued.  Waymon BudgeYOUNG,CLINTON D Diplomate, American Board of Sleep Medicine  ELECTRONICALLY SIGNED ON:  03/26/2015, 9:56 AM [facility] PH: [phone]   FX: [phone] ACCREDITED BY THE AMERICAN ACADEMY OF SLEEP MEDICINE

## 2015-04-21 ENCOUNTER — Telehealth: Payer: Self-pay

## 2015-04-21 NOTE — Telephone Encounter (Signed)
Called patient to inquire about flu vaccine. Patient stated he never gets the flu vaccine and does not plan on it.

## 2015-04-28 DIAGNOSIS — K449 Diaphragmatic hernia without obstruction or gangrene: Secondary | ICD-10-CM | POA: Diagnosis not present

## 2015-04-28 DIAGNOSIS — Z13810 Encounter for screening for upper gastrointestinal disorder: Secondary | ICD-10-CM | POA: Diagnosis not present

## 2015-04-28 DIAGNOSIS — K219 Gastro-esophageal reflux disease without esophagitis: Secondary | ICD-10-CM | POA: Diagnosis not present

## 2015-05-14 DIAGNOSIS — G4733 Obstructive sleep apnea (adult) (pediatric): Secondary | ICD-10-CM | POA: Diagnosis not present

## 2015-05-22 HISTORY — PX: ESOPHAGEAL MANOMETRY: SHX1526

## 2015-05-25 DIAGNOSIS — K228 Other specified diseases of esophagus: Secondary | ICD-10-CM | POA: Diagnosis not present

## 2015-05-25 DIAGNOSIS — K449 Diaphragmatic hernia without obstruction or gangrene: Secondary | ICD-10-CM | POA: Diagnosis not present

## 2015-05-25 DIAGNOSIS — K219 Gastro-esophageal reflux disease without esophagitis: Secondary | ICD-10-CM | POA: Diagnosis not present

## 2015-06-08 ENCOUNTER — Encounter: Payer: Self-pay | Admitting: Family Medicine

## 2015-06-13 DIAGNOSIS — Z87891 Personal history of nicotine dependence: Secondary | ICD-10-CM | POA: Diagnosis not present

## 2015-06-13 DIAGNOSIS — R918 Other nonspecific abnormal finding of lung field: Secondary | ICD-10-CM | POA: Diagnosis not present

## 2015-06-13 DIAGNOSIS — R079 Chest pain, unspecified: Secondary | ICD-10-CM | POA: Diagnosis not present

## 2015-06-13 DIAGNOSIS — J90 Pleural effusion, not elsewhere classified: Secondary | ICD-10-CM | POA: Diagnosis not present

## 2015-06-13 DIAGNOSIS — R1114 Bilious vomiting: Secondary | ICD-10-CM | POA: Diagnosis not present

## 2015-06-13 DIAGNOSIS — R948 Abnormal results of function studies of other organs and systems: Secondary | ICD-10-CM | POA: Diagnosis not present

## 2015-06-13 DIAGNOSIS — R938 Abnormal findings on diagnostic imaging of other specified body structures: Secondary | ICD-10-CM | POA: Diagnosis not present

## 2015-06-13 DIAGNOSIS — R1013 Epigastric pain: Secondary | ICD-10-CM | POA: Diagnosis not present

## 2015-06-13 DIAGNOSIS — G4733 Obstructive sleep apnea (adult) (pediatric): Secondary | ICD-10-CM | POA: Diagnosis not present

## 2015-07-14 DIAGNOSIS — G4733 Obstructive sleep apnea (adult) (pediatric): Secondary | ICD-10-CM | POA: Diagnosis not present

## 2015-08-11 DIAGNOSIS — Z87891 Personal history of nicotine dependence: Secondary | ICD-10-CM | POA: Diagnosis not present

## 2015-08-11 DIAGNOSIS — N200 Calculus of kidney: Secondary | ICD-10-CM | POA: Diagnosis not present

## 2015-08-11 DIAGNOSIS — R1031 Right lower quadrant pain: Secondary | ICD-10-CM | POA: Diagnosis not present

## 2015-08-11 DIAGNOSIS — N202 Calculus of kidney with calculus of ureter: Secondary | ICD-10-CM | POA: Diagnosis not present

## 2015-08-11 DIAGNOSIS — K219 Gastro-esophageal reflux disease without esophagitis: Secondary | ICD-10-CM | POA: Diagnosis not present

## 2015-08-11 DIAGNOSIS — R319 Hematuria, unspecified: Secondary | ICD-10-CM | POA: Diagnosis not present

## 2015-08-11 DIAGNOSIS — G4700 Insomnia, unspecified: Secondary | ICD-10-CM | POA: Diagnosis not present

## 2015-08-14 DIAGNOSIS — N200 Calculus of kidney: Secondary | ICD-10-CM | POA: Diagnosis not present

## 2015-08-15 DIAGNOSIS — N132 Hydronephrosis with renal and ureteral calculous obstruction: Secondary | ICD-10-CM | POA: Diagnosis not present

## 2015-08-15 DIAGNOSIS — N200 Calculus of kidney: Secondary | ICD-10-CM | POA: Diagnosis not present

## 2015-08-15 DIAGNOSIS — R319 Hematuria, unspecified: Secondary | ICD-10-CM | POA: Diagnosis not present

## 2015-08-15 DIAGNOSIS — R1031 Right lower quadrant pain: Secondary | ICD-10-CM | POA: Diagnosis not present

## 2015-08-15 DIAGNOSIS — Z9889 Other specified postprocedural states: Secondary | ICD-10-CM | POA: Diagnosis not present

## 2015-08-15 DIAGNOSIS — R109 Unspecified abdominal pain: Secondary | ICD-10-CM | POA: Diagnosis not present

## 2015-08-15 DIAGNOSIS — K219 Gastro-esophageal reflux disease without esophagitis: Secondary | ICD-10-CM | POA: Diagnosis not present

## 2015-08-15 DIAGNOSIS — Z87442 Personal history of urinary calculi: Secondary | ICD-10-CM | POA: Diagnosis not present

## 2015-08-15 DIAGNOSIS — R112 Nausea with vomiting, unspecified: Secondary | ICD-10-CM | POA: Diagnosis not present

## 2015-08-16 DIAGNOSIS — N200 Calculus of kidney: Secondary | ICD-10-CM | POA: Diagnosis not present

## 2015-08-22 DIAGNOSIS — N200 Calculus of kidney: Secondary | ICD-10-CM | POA: Diagnosis not present

## 2015-09-04 DIAGNOSIS — N200 Calculus of kidney: Secondary | ICD-10-CM | POA: Diagnosis not present

## 2015-09-06 DIAGNOSIS — N200 Calculus of kidney: Secondary | ICD-10-CM | POA: Diagnosis not present

## 2015-09-28 DIAGNOSIS — R109 Unspecified abdominal pain: Secondary | ICD-10-CM | POA: Diagnosis not present

## 2015-09-28 DIAGNOSIS — R112 Nausea with vomiting, unspecified: Secondary | ICD-10-CM | POA: Diagnosis not present

## 2015-09-28 DIAGNOSIS — N200 Calculus of kidney: Secondary | ICD-10-CM | POA: Diagnosis not present

## 2015-09-28 DIAGNOSIS — K219 Gastro-esophageal reflux disease without esophagitis: Secondary | ICD-10-CM | POA: Diagnosis not present

## 2015-09-28 DIAGNOSIS — Z87891 Personal history of nicotine dependence: Secondary | ICD-10-CM | POA: Diagnosis not present

## 2015-09-28 DIAGNOSIS — Z79899 Other long term (current) drug therapy: Secondary | ICD-10-CM | POA: Diagnosis not present

## 2015-09-28 DIAGNOSIS — R300 Dysuria: Secondary | ICD-10-CM | POA: Diagnosis not present

## 2015-09-28 DIAGNOSIS — Z7982 Long term (current) use of aspirin: Secondary | ICD-10-CM | POA: Diagnosis not present

## 2015-09-28 DIAGNOSIS — Z888 Allergy status to other drugs, medicaments and biological substances status: Secondary | ICD-10-CM | POA: Diagnosis not present

## 2015-09-28 DIAGNOSIS — Z87442 Personal history of urinary calculi: Secondary | ICD-10-CM | POA: Diagnosis not present

## 2015-09-28 DIAGNOSIS — R1032 Left lower quadrant pain: Secondary | ICD-10-CM | POA: Diagnosis not present

## 2015-10-10 DIAGNOSIS — G4733 Obstructive sleep apnea (adult) (pediatric): Secondary | ICD-10-CM | POA: Diagnosis not present

## 2015-10-31 DIAGNOSIS — N200 Calculus of kidney: Secondary | ICD-10-CM | POA: Diagnosis not present

## 2015-11-28 DIAGNOSIS — G4733 Obstructive sleep apnea (adult) (pediatric): Secondary | ICD-10-CM | POA: Diagnosis not present

## 2015-12-05 ENCOUNTER — Other Ambulatory Visit: Payer: Self-pay | Admitting: Family Medicine

## 2016-02-20 DIAGNOSIS — G4733 Obstructive sleep apnea (adult) (pediatric): Secondary | ICD-10-CM | POA: Diagnosis not present

## 2016-03-05 DIAGNOSIS — N138 Other obstructive and reflux uropathy: Secondary | ICD-10-CM | POA: Diagnosis not present

## 2016-03-05 DIAGNOSIS — T387X5A Adverse effect of androgens and anabolic congeners, initial encounter: Secondary | ICD-10-CM | POA: Diagnosis not present

## 2016-03-05 DIAGNOSIS — N401 Enlarged prostate with lower urinary tract symptoms: Secondary | ICD-10-CM | POA: Diagnosis not present

## 2016-03-05 DIAGNOSIS — E291 Testicular hypofunction: Secondary | ICD-10-CM | POA: Diagnosis not present

## 2016-03-05 DIAGNOSIS — R358 Other polyuria: Secondary | ICD-10-CM | POA: Diagnosis not present

## 2016-03-05 DIAGNOSIS — N200 Calculus of kidney: Secondary | ICD-10-CM | POA: Diagnosis not present

## 2016-03-07 ENCOUNTER — Other Ambulatory Visit: Payer: Self-pay | Admitting: Family Medicine

## 2016-03-07 NOTE — Telephone Encounter (Signed)
[facility].  RF request for omeprazole LOV: 03/08/15 Next ov: None Last written: 12/05/15 #30 w/ 3RF

## 2016-03-21 DIAGNOSIS — E291 Testicular hypofunction: Secondary | ICD-10-CM | POA: Diagnosis not present

## 2016-03-28 DIAGNOSIS — E291 Testicular hypofunction: Secondary | ICD-10-CM | POA: Diagnosis not present

## 2016-05-24 DIAGNOSIS — G4733 Obstructive sleep apnea (adult) (pediatric): Secondary | ICD-10-CM | POA: Diagnosis not present

## 2016-06-04 DIAGNOSIS — M9901 Segmental and somatic dysfunction of cervical region: Secondary | ICD-10-CM | POA: Diagnosis not present

## 2016-06-04 DIAGNOSIS — M531 Cervicobrachial syndrome: Secondary | ICD-10-CM | POA: Diagnosis not present

## 2016-06-04 DIAGNOSIS — M9902 Segmental and somatic dysfunction of thoracic region: Secondary | ICD-10-CM | POA: Diagnosis not present

## 2016-07-03 DIAGNOSIS — G4733 Obstructive sleep apnea (adult) (pediatric): Secondary | ICD-10-CM | POA: Diagnosis not present

## 2016-07-11 DIAGNOSIS — M9902 Segmental and somatic dysfunction of thoracic region: Secondary | ICD-10-CM | POA: Diagnosis not present

## 2016-07-11 DIAGNOSIS — M531 Cervicobrachial syndrome: Secondary | ICD-10-CM | POA: Diagnosis not present

## 2016-07-11 DIAGNOSIS — M9901 Segmental and somatic dysfunction of cervical region: Secondary | ICD-10-CM | POA: Diagnosis not present

## 2016-08-08 ENCOUNTER — Ambulatory Visit (INDEPENDENT_AMBULATORY_CARE_PROVIDER_SITE_OTHER): Payer: BLUE CROSS/BLUE SHIELD | Admitting: Family Medicine

## 2016-08-08 ENCOUNTER — Encounter: Payer: Self-pay | Admitting: Family Medicine

## 2016-08-08 VITALS — BP 107/70 | HR 69 | Temp 97.6°F | Resp 16 | Ht 73.0 in | Wt 254.8 lb

## 2016-08-08 DIAGNOSIS — H6123 Impacted cerumen, bilateral: Secondary | ICD-10-CM

## 2016-08-08 MED ORDER — OMEPRAZOLE 40 MG PO CPDR: DELAYED_RELEASE_CAPSULE | ORAL | 1 refills | Status: DC
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
## 2016-08-13 DIAGNOSIS — M9902 Segmental and somatic dysfunction of thoracic region: Secondary | ICD-10-CM | POA: Diagnosis not present

## 2016-08-13 DIAGNOSIS — M9901 Segmental and somatic dysfunction of cervical region: Secondary | ICD-10-CM | POA: Diagnosis not present

## 2016-08-13 DIAGNOSIS — M531 Cervicobrachial syndrome: Secondary | ICD-10-CM | POA: Diagnosis not present

## 2016-10-04 DIAGNOSIS — G4733 Obstructive sleep apnea (adult) (pediatric): Secondary | ICD-10-CM | POA: Diagnosis not present

## 2016-10-07 DIAGNOSIS — M9902 Segmental and somatic dysfunction of thoracic region: Secondary | ICD-10-CM | POA: Diagnosis not present

## 2016-10-07 DIAGNOSIS — M9901 Segmental and somatic dysfunction of cervical region: Secondary | ICD-10-CM | POA: Diagnosis not present

## 2016-10-07 DIAGNOSIS — M531 Cervicobrachial syndrome: Secondary | ICD-10-CM | POA: Diagnosis not present

## 2017-01-21 DIAGNOSIS — Z860101 Personal history of adenomatous and serrated colon polyps: Secondary | ICD-10-CM

## 2017-01-21 DIAGNOSIS — Z86010 Personal history of colonic polyps: Secondary | ICD-10-CM

## 2017-01-21 HISTORY — DX: Personal history of colonic polyps: Z86.010

## 2017-01-21 HISTORY — DX: Personal history of adenomatous and serrated colon polyps: Z86.0101

## 2017-02-17 DIAGNOSIS — M531 Cervicobrachial syndrome: Secondary | ICD-10-CM | POA: Diagnosis not present

## 2017-02-17 DIAGNOSIS — M9901 Segmental and somatic dysfunction of cervical region: Secondary | ICD-10-CM | POA: Diagnosis not present

## 2017-02-17 DIAGNOSIS — M9902 Segmental and somatic dysfunction of thoracic region: Secondary | ICD-10-CM | POA: Diagnosis not present

## 2017-02-27 ENCOUNTER — Other Ambulatory Visit: Payer: Self-pay | Admitting: Family Medicine

## 2017-02-28 NOTE — Telephone Encounter (Signed)
Pt is over due for follow up or CPE. Will send Rx for #180 w/ 0RF. Needs to come in before this runs out to see Dr. Milinda CaveMcGowen for follow up or CPE.

## 2017-03-03 DIAGNOSIS — N528 Other male erectile dysfunction: Secondary | ICD-10-CM | POA: Diagnosis not present

## 2017-03-04 NOTE — Telephone Encounter (Signed)
Pt advised and voiced understanding. CPE scheduled for 05/07/17 at 3:30pm.

## 2017-03-20 DIAGNOSIS — N528 Other male erectile dysfunction: Secondary | ICD-10-CM | POA: Diagnosis not present

## 2017-03-24 DIAGNOSIS — M542 Cervicalgia: Secondary | ICD-10-CM | POA: Diagnosis not present

## 2017-03-24 DIAGNOSIS — N529 Male erectile dysfunction, unspecified: Secondary | ICD-10-CM | POA: Diagnosis not present

## 2017-03-24 DIAGNOSIS — Z01810 Encounter for preprocedural cardiovascular examination: Secondary | ICD-10-CM | POA: Diagnosis not present

## 2017-03-24 DIAGNOSIS — I493 Ventricular premature depolarization: Secondary | ICD-10-CM | POA: Diagnosis not present

## 2017-03-24 DIAGNOSIS — K219 Gastro-esophageal reflux disease without esophagitis: Secondary | ICD-10-CM | POA: Diagnosis not present

## 2017-03-26 DIAGNOSIS — G709 Myoneural disorder, unspecified: Secondary | ICD-10-CM | POA: Diagnosis not present

## 2017-03-26 DIAGNOSIS — I10 Essential (primary) hypertension: Secondary | ICD-10-CM | POA: Diagnosis not present

## 2017-03-26 DIAGNOSIS — N529 Male erectile dysfunction, unspecified: Secondary | ICD-10-CM | POA: Diagnosis not present

## 2017-03-26 DIAGNOSIS — E669 Obesity, unspecified: Secondary | ICD-10-CM | POA: Diagnosis not present

## 2017-03-26 DIAGNOSIS — Z9989 Dependence on other enabling machines and devices: Secondary | ICD-10-CM | POA: Diagnosis not present

## 2017-03-26 DIAGNOSIS — K449 Diaphragmatic hernia without obstruction or gangrene: Secondary | ICD-10-CM | POA: Diagnosis not present

## 2017-03-26 DIAGNOSIS — Z6834 Body mass index (BMI) 34.0-34.9, adult: Secondary | ICD-10-CM | POA: Diagnosis not present

## 2017-03-26 DIAGNOSIS — Z87891 Personal history of nicotine dependence: Secondary | ICD-10-CM | POA: Diagnosis not present

## 2017-03-26 DIAGNOSIS — K219 Gastro-esophageal reflux disease without esophagitis: Secondary | ICD-10-CM | POA: Diagnosis not present

## 2017-03-26 DIAGNOSIS — G4730 Sleep apnea, unspecified: Secondary | ICD-10-CM | POA: Diagnosis not present

## 2017-03-26 DIAGNOSIS — Z7982 Long term (current) use of aspirin: Secondary | ICD-10-CM | POA: Diagnosis not present

## 2017-03-26 DIAGNOSIS — Z888 Allergy status to other drugs, medicaments and biological substances status: Secondary | ICD-10-CM | POA: Diagnosis not present

## 2017-03-26 DIAGNOSIS — Z79899 Other long term (current) drug therapy: Secondary | ICD-10-CM | POA: Diagnosis not present

## 2017-03-26 DIAGNOSIS — N528 Other male erectile dysfunction: Secondary | ICD-10-CM | POA: Diagnosis not present

## 2017-03-27 DIAGNOSIS — G4730 Sleep apnea, unspecified: Secondary | ICD-10-CM | POA: Diagnosis not present

## 2017-03-27 DIAGNOSIS — I10 Essential (primary) hypertension: Secondary | ICD-10-CM | POA: Diagnosis not present

## 2017-03-27 DIAGNOSIS — Z79899 Other long term (current) drug therapy: Secondary | ICD-10-CM | POA: Diagnosis not present

## 2017-03-27 DIAGNOSIS — Z6834 Body mass index (BMI) 34.0-34.9, adult: Secondary | ICD-10-CM | POA: Diagnosis not present

## 2017-03-27 DIAGNOSIS — K449 Diaphragmatic hernia without obstruction or gangrene: Secondary | ICD-10-CM | POA: Diagnosis not present

## 2017-03-27 DIAGNOSIS — K219 Gastro-esophageal reflux disease without esophagitis: Secondary | ICD-10-CM | POA: Diagnosis not present

## 2017-03-27 DIAGNOSIS — N529 Male erectile dysfunction, unspecified: Secondary | ICD-10-CM | POA: Diagnosis not present

## 2017-03-27 DIAGNOSIS — Z888 Allergy status to other drugs, medicaments and biological substances status: Secondary | ICD-10-CM | POA: Diagnosis not present

## 2017-03-27 DIAGNOSIS — G709 Myoneural disorder, unspecified: Secondary | ICD-10-CM | POA: Diagnosis not present

## 2017-03-27 DIAGNOSIS — E669 Obesity, unspecified: Secondary | ICD-10-CM | POA: Diagnosis not present

## 2017-03-27 DIAGNOSIS — Z87891 Personal history of nicotine dependence: Secondary | ICD-10-CM | POA: Diagnosis not present

## 2017-03-27 DIAGNOSIS — Z9989 Dependence on other enabling machines and devices: Secondary | ICD-10-CM | POA: Diagnosis not present

## 2017-03-27 DIAGNOSIS — Z7982 Long term (current) use of aspirin: Secondary | ICD-10-CM | POA: Diagnosis not present

## 2017-04-07 DIAGNOSIS — G4733 Obstructive sleep apnea (adult) (pediatric): Secondary | ICD-10-CM | POA: Diagnosis not present

## 2017-04-10 DIAGNOSIS — N528 Other male erectile dysfunction: Secondary | ICD-10-CM | POA: Diagnosis not present

## 2017-04-22 DIAGNOSIS — M9902 Segmental and somatic dysfunction of thoracic region: Secondary | ICD-10-CM | POA: Diagnosis not present

## 2017-04-22 DIAGNOSIS — M9901 Segmental and somatic dysfunction of cervical region: Secondary | ICD-10-CM | POA: Diagnosis not present

## 2017-04-22 DIAGNOSIS — M531 Cervicobrachial syndrome: Secondary | ICD-10-CM | POA: Diagnosis not present

## 2017-05-07 ENCOUNTER — Encounter: Payer: BLUE CROSS/BLUE SHIELD | Admitting: Family Medicine
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
## 2017-05-12 ENCOUNTER — Encounter: Payer: Self-pay | Admitting: Family Medicine

## 2017-05-13 DIAGNOSIS — N528 Other male erectile dysfunction: Secondary | ICD-10-CM | POA: Diagnosis not present

## 2017-06-04 ENCOUNTER — Encounter: Payer: Self-pay | Admitting: Family Medicine

## 2017-06-04 ENCOUNTER — Ambulatory Visit: Payer: BLUE CROSS/BLUE SHIELD | Admitting: Family Medicine

## 2017-06-04 VITALS — BP 118/82 | HR 70 | Resp 16 | Ht 73.0 in | Wt 258.0 lb

## 2017-06-04 DIAGNOSIS — Z1211 Encounter for screening for malignant neoplasm of colon: Secondary | ICD-10-CM | POA: Diagnosis not present

## 2017-06-04 DIAGNOSIS — K219 Gastro-esophageal reflux disease without esophagitis: Secondary | ICD-10-CM | POA: Diagnosis not present

## 2017-06-04 MED ORDER — OMEPRAZOLE 40 MG PO CPDR: DELAYED_RELEASE_CAPSULE | ORAL | 3 refills | Status: DC
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
## 2017-06-24 DIAGNOSIS — N200 Calculus of kidney: Secondary | ICD-10-CM | POA: Diagnosis not present

## 2017-07-03 DIAGNOSIS — N528 Other male erectile dysfunction: Secondary | ICD-10-CM | POA: Diagnosis not present

## 2017-07-11 DIAGNOSIS — G4733 Obstructive sleep apnea (adult) (pediatric): Secondary | ICD-10-CM | POA: Diagnosis not present

## 2017-07-21 DIAGNOSIS — T83420A Displacement of penile (implanted) prosthesis, initial encounter: Secondary | ICD-10-CM

## 2017-07-21 HISTORY — DX: Displacement of implanted penile prosthesis, initial encounter: T83.420A

## 2017-07-22 DIAGNOSIS — E291 Testicular hypofunction: Secondary | ICD-10-CM | POA: Diagnosis not present

## 2017-07-22 DIAGNOSIS — T387X5D Adverse effect of androgens and anabolic congeners, subsequent encounter: Secondary | ICD-10-CM | POA: Diagnosis not present

## 2017-07-22 DIAGNOSIS — N401 Enlarged prostate with lower urinary tract symptoms: Secondary | ICD-10-CM | POA: Diagnosis not present

## 2017-07-22 DIAGNOSIS — N138 Other obstructive and reflux uropathy: Secondary | ICD-10-CM | POA: Diagnosis not present

## 2017-07-22 DIAGNOSIS — N528 Other male erectile dysfunction: Secondary | ICD-10-CM | POA: Diagnosis not present

## 2017-07-22 DIAGNOSIS — R358 Other polyuria: Secondary | ICD-10-CM | POA: Diagnosis not present

## 2017-07-30 DIAGNOSIS — K219 Gastro-esophageal reflux disease without esophagitis: Secondary | ICD-10-CM | POA: Diagnosis not present

## 2017-07-30 DIAGNOSIS — Z1211 Encounter for screening for malignant neoplasm of colon: Secondary | ICD-10-CM | POA: Diagnosis not present

## 2017-07-30 DIAGNOSIS — K640 First degree hemorrhoids: Secondary | ICD-10-CM | POA: Diagnosis not present

## 2017-07-30 DIAGNOSIS — K635 Polyp of colon: Secondary | ICD-10-CM | POA: Diagnosis not present

## 2017-07-30 DIAGNOSIS — D125 Benign neoplasm of sigmoid colon: Secondary | ICD-10-CM | POA: Diagnosis not present

## 2017-07-30 DIAGNOSIS — K5730 Diverticulosis of large intestine without perforation or abscess without bleeding: Secondary | ICD-10-CM | POA: Diagnosis not present

## 2017-07-30 DIAGNOSIS — Z9989 Dependence on other enabling machines and devices: Secondary | ICD-10-CM | POA: Diagnosis not present

## 2017-07-30 HISTORY — PX: COLONOSCOPY: SHX174

## 2017-07-30 LAB — HM COLONOSCOPY

## 2017-07-31 ENCOUNTER — Encounter: Payer: Self-pay | Admitting: *Deleted

## 2017-08-06 ENCOUNTER — Encounter: Payer: Self-pay | Admitting: Family Medicine

## 2017-08-20 DIAGNOSIS — Y731 Therapeutic (nonsurgical) and rehabilitative gastroenterology and urology devices associated with adverse incidents: Secondary | ICD-10-CM | POA: Diagnosis not present

## 2017-08-20 DIAGNOSIS — T83420A Displacement of penile (implanted) prosthesis, initial encounter: Secondary | ICD-10-CM | POA: Diagnosis not present

## 2017-08-20 DIAGNOSIS — T8389XA Other specified complication of genitourinary prosthetic devices, implants and grafts, initial encounter: Secondary | ICD-10-CM | POA: Diagnosis not present

## 2017-08-21 DIAGNOSIS — T8389XA Other specified complication of genitourinary prosthetic devices, implants and grafts, initial encounter: Secondary | ICD-10-CM | POA: Diagnosis not present

## 2017-08-21 DIAGNOSIS — Y731 Therapeutic (nonsurgical) and rehabilitative gastroenterology and urology devices associated with adverse incidents: Secondary | ICD-10-CM | POA: Diagnosis not present

## 2017-09-01 DIAGNOSIS — M531 Cervicobrachial syndrome: Secondary | ICD-10-CM | POA: Diagnosis not present

## 2017-09-01 DIAGNOSIS — M9902 Segmental and somatic dysfunction of thoracic region: Secondary | ICD-10-CM | POA: Diagnosis not present

## 2017-09-01 DIAGNOSIS — X58XXXA Exposure to other specified factors, initial encounter: Secondary | ICD-10-CM | POA: Diagnosis not present

## 2017-09-01 DIAGNOSIS — S39012A Strain of muscle, fascia and tendon of lower back, initial encounter: Secondary | ICD-10-CM | POA: Diagnosis not present

## 2017-09-01 DIAGNOSIS — M9901 Segmental and somatic dysfunction of cervical region: Secondary | ICD-10-CM | POA: Diagnosis not present

## 2017-09-01 DIAGNOSIS — Y999 Unspecified external cause status: Secondary | ICD-10-CM | POA: Diagnosis not present

## 2017-09-02 DIAGNOSIS — M9902 Segmental and somatic dysfunction of thoracic region: Secondary | ICD-10-CM | POA: Diagnosis not present

## 2017-09-02 DIAGNOSIS — M531 Cervicobrachial syndrome: Secondary | ICD-10-CM | POA: Diagnosis not present

## 2017-09-02 DIAGNOSIS — M9901 Segmental and somatic dysfunction of cervical region: Secondary | ICD-10-CM | POA: Diagnosis not present

## 2017-09-03 DIAGNOSIS — M9901 Segmental and somatic dysfunction of cervical region: Secondary | ICD-10-CM | POA: Diagnosis not present

## 2017-09-03 DIAGNOSIS — M531 Cervicobrachial syndrome: Secondary | ICD-10-CM | POA: Diagnosis not present

## 2017-09-03 DIAGNOSIS — M9902 Segmental and somatic dysfunction of thoracic region: Secondary | ICD-10-CM | POA: Diagnosis not present

## 2017-09-04 DIAGNOSIS — M9902 Segmental and somatic dysfunction of thoracic region: Secondary | ICD-10-CM | POA: Diagnosis not present

## 2017-09-04 DIAGNOSIS — T83420A Displacement of penile (implanted) prosthesis, initial encounter: Secondary | ICD-10-CM | POA: Diagnosis not present

## 2017-09-04 DIAGNOSIS — M531 Cervicobrachial syndrome: Secondary | ICD-10-CM | POA: Diagnosis not present

## 2017-09-04 DIAGNOSIS — M9901 Segmental and somatic dysfunction of cervical region: Secondary | ICD-10-CM | POA: Diagnosis not present

## 2017-09-05 DIAGNOSIS — M531 Cervicobrachial syndrome: Secondary | ICD-10-CM | POA: Diagnosis not present

## 2017-09-05 DIAGNOSIS — M9902 Segmental and somatic dysfunction of thoracic region: Secondary | ICD-10-CM | POA: Diagnosis not present

## 2017-09-05 DIAGNOSIS — M9901 Segmental and somatic dysfunction of cervical region: Secondary | ICD-10-CM | POA: Diagnosis not present

## 2017-09-06 DIAGNOSIS — M531 Cervicobrachial syndrome: Secondary | ICD-10-CM | POA: Diagnosis not present

## 2017-09-06 DIAGNOSIS — M9901 Segmental and somatic dysfunction of cervical region: Secondary | ICD-10-CM | POA: Diagnosis not present

## 2017-09-06 DIAGNOSIS — M9902 Segmental and somatic dysfunction of thoracic region: Secondary | ICD-10-CM | POA: Diagnosis not present

## 2017-09-08 DIAGNOSIS — M9901 Segmental and somatic dysfunction of cervical region: Secondary | ICD-10-CM | POA: Diagnosis not present

## 2017-09-08 DIAGNOSIS — M531 Cervicobrachial syndrome: Secondary | ICD-10-CM | POA: Diagnosis not present

## 2017-09-08 DIAGNOSIS — M9902 Segmental and somatic dysfunction of thoracic region: Secondary | ICD-10-CM | POA: Diagnosis not present

## 2017-09-09 DIAGNOSIS — M9902 Segmental and somatic dysfunction of thoracic region: Secondary | ICD-10-CM | POA: Diagnosis not present

## 2017-09-09 DIAGNOSIS — M9901 Segmental and somatic dysfunction of cervical region: Secondary | ICD-10-CM | POA: Diagnosis not present

## 2017-09-09 DIAGNOSIS — M531 Cervicobrachial syndrome: Secondary | ICD-10-CM | POA: Diagnosis not present

## 2017-09-10 DIAGNOSIS — M531 Cervicobrachial syndrome: Secondary | ICD-10-CM | POA: Diagnosis not present

## 2017-09-10 DIAGNOSIS — M9901 Segmental and somatic dysfunction of cervical region: Secondary | ICD-10-CM | POA: Diagnosis not present

## 2017-09-10 DIAGNOSIS — M9902 Segmental and somatic dysfunction of thoracic region: Secondary | ICD-10-CM | POA: Diagnosis not present

## 2017-09-11 DIAGNOSIS — M9902 Segmental and somatic dysfunction of thoracic region: Secondary | ICD-10-CM | POA: Diagnosis not present

## 2017-09-11 DIAGNOSIS — M9901 Segmental and somatic dysfunction of cervical region: Secondary | ICD-10-CM | POA: Diagnosis not present

## 2017-09-11 DIAGNOSIS — M531 Cervicobrachial syndrome: Secondary | ICD-10-CM | POA: Diagnosis not present

## 2017-09-12 DIAGNOSIS — M545 Low back pain: Secondary | ICD-10-CM | POA: Diagnosis not present

## 2017-09-15 DIAGNOSIS — M9902 Segmental and somatic dysfunction of thoracic region: Secondary | ICD-10-CM | POA: Diagnosis not present

## 2017-09-15 DIAGNOSIS — M9901 Segmental and somatic dysfunction of cervical region: Secondary | ICD-10-CM | POA: Diagnosis not present

## 2017-09-15 DIAGNOSIS — M531 Cervicobrachial syndrome: Secondary | ICD-10-CM | POA: Diagnosis not present

## 2017-09-17 DIAGNOSIS — M545 Low back pain: Secondary | ICD-10-CM | POA: Diagnosis not present

## 2017-09-24 DIAGNOSIS — M5416 Radiculopathy, lumbar region: Secondary | ICD-10-CM | POA: Diagnosis not present

## 2017-09-24 DIAGNOSIS — M545 Low back pain: Secondary | ICD-10-CM | POA: Diagnosis not present

## 2017-09-25 DIAGNOSIS — N528 Other male erectile dysfunction: Secondary | ICD-10-CM | POA: Diagnosis not present

## 2017-10-09 DIAGNOSIS — N528 Other male erectile dysfunction: Secondary | ICD-10-CM | POA: Diagnosis not present

## 2017-10-16 DIAGNOSIS — N528 Other male erectile dysfunction: Secondary | ICD-10-CM | POA: Diagnosis not present

## 2017-10-20 DIAGNOSIS — X58XXXA Exposure to other specified factors, initial encounter: Secondary | ICD-10-CM | POA: Diagnosis not present

## 2017-10-20 DIAGNOSIS — S31109A Unspecified open wound of abdominal wall, unspecified quadrant without penetration into peritoneal cavity, initial encounter: Secondary | ICD-10-CM | POA: Diagnosis not present

## 2017-10-20 DIAGNOSIS — Z9889 Other specified postprocedural states: Secondary | ICD-10-CM | POA: Diagnosis not present

## 2017-10-20 DIAGNOSIS — T8189XA Other complications of procedures, not elsewhere classified, initial encounter: Secondary | ICD-10-CM | POA: Diagnosis not present

## 2017-10-27 DIAGNOSIS — G4733 Obstructive sleep apnea (adult) (pediatric): Secondary | ICD-10-CM | POA: Diagnosis not present

## 2017-10-28 DIAGNOSIS — T8189XA Other complications of procedures, not elsewhere classified, initial encounter: Secondary | ICD-10-CM | POA: Diagnosis not present

## 2017-12-21 DIAGNOSIS — M25562 Pain in left knee: Secondary | ICD-10-CM

## 2017-12-21 HISTORY — DX: Pain in left knee: M25.562

## 2018-01-15 ENCOUNTER — Ambulatory Visit (INDEPENDENT_AMBULATORY_CARE_PROVIDER_SITE_OTHER): Payer: Self-pay

## 2018-01-15 ENCOUNTER — Ambulatory Visit (INDEPENDENT_AMBULATORY_CARE_PROVIDER_SITE_OTHER): Payer: BLUE CROSS/BLUE SHIELD | Admitting: Orthopedic Surgery

## 2018-01-15 ENCOUNTER — Encounter (INDEPENDENT_AMBULATORY_CARE_PROVIDER_SITE_OTHER): Payer: Self-pay | Admitting: Orthopedic Surgery

## 2018-01-15 VITALS — BP 139/76 | HR 69 | Resp 16 | Ht 73.0 in | Wt 251.0 lb

## 2018-01-15 DIAGNOSIS — M898X6 Other specified disorders of bone, lower leg: Secondary | ICD-10-CM | POA: Diagnosis not present

## 2018-01-15 DIAGNOSIS — M25562 Pain in left knee: Secondary | ICD-10-CM

## 2018-01-15 DIAGNOSIS — M898X5 Other specified disorders of bone, thigh: Secondary | ICD-10-CM | POA: Diagnosis not present

## 2018-01-15 MED ORDER — LIDOCAINE HCL 1 % IJ SOLN
2.0000 mL | INTRAMUSCULAR | Status: AC | PRN
  Administered 2018-01-15: 2 mL

## 2018-01-15 MED ORDER — METHYLPREDNISOLONE ACETATE 40 MG/ML IJ SUSP
80.0000 mg | INTRAMUSCULAR | Status: AC | PRN
  Administered 2018-01-15: 80 mg

## 2018-01-15 MED ORDER — BUPIVACAINE HCL 0.5 % IJ SOLN
2.0000 mL | INTRAMUSCULAR | Status: AC | PRN
  Administered 2018-01-15: 2 mL via INTRA_ARTICULAR
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
## 2018-01-18 ENCOUNTER — Encounter: Payer: Self-pay | Admitting: Family Medicine

## 2018-01-22 DIAGNOSIS — M9904 Segmental and somatic dysfunction of sacral region: Secondary | ICD-10-CM | POA: Diagnosis not present

## 2018-01-22 DIAGNOSIS — M4727 Other spondylosis with radiculopathy, lumbosacral region: Secondary | ICD-10-CM | POA: Diagnosis not present

## 2018-01-22 DIAGNOSIS — M9903 Segmental and somatic dysfunction of lumbar region: Secondary | ICD-10-CM | POA: Diagnosis not present

## 2018-01-22 DIAGNOSIS — S336XXA Sprain of sacroiliac joint, initial encounter: Secondary | ICD-10-CM | POA: Diagnosis not present

## 2018-01-29 DIAGNOSIS — T8189XA Other complications of procedures, not elsewhere classified, initial encounter: Secondary | ICD-10-CM | POA: Diagnosis not present

## 2018-02-03 DIAGNOSIS — G4733 Obstructive sleep apnea (adult) (pediatric): Secondary | ICD-10-CM | POA: Diagnosis not present

## 2018-02-17 ENCOUNTER — Telehealth (INDEPENDENT_AMBULATORY_CARE_PROVIDER_SITE_OTHER): Payer: Self-pay | Admitting: Orthopaedic Surgery

## 2018-02-17 NOTE — Telephone Encounter (Signed)
Patient called stating he saw Spencer Harrington on 01/15/18 and had cortisone injection in his left knee.  Patient states Spencer Harrington told him to call back if the pain returns.  Patient is requesting a return call before scheduling an appointment.

## 2018-02-17 NOTE — Telephone Encounter (Signed)
Please advise 

## 2018-02-19 NOTE — Telephone Encounter (Signed)
He will call and make an appt with Dr Cleophas Dunker

## 2018-02-20 DIAGNOSIS — T8189XA Other complications of procedures, not elsewhere classified, initial encounter: Secondary | ICD-10-CM | POA: Diagnosis not present

## 2018-02-23 ENCOUNTER — Ambulatory Visit (INDEPENDENT_AMBULATORY_CARE_PROVIDER_SITE_OTHER): Payer: BLUE CROSS/BLUE SHIELD | Admitting: Orthopaedic Surgery

## 2018-02-24 ENCOUNTER — Encounter (INDEPENDENT_AMBULATORY_CARE_PROVIDER_SITE_OTHER): Payer: Self-pay | Admitting: Orthopaedic Surgery

## 2018-02-24 ENCOUNTER — Ambulatory Visit (INDEPENDENT_AMBULATORY_CARE_PROVIDER_SITE_OTHER): Payer: BLUE CROSS/BLUE SHIELD | Admitting: Orthopaedic Surgery

## 2018-02-24 ENCOUNTER — Ambulatory Visit (INDEPENDENT_AMBULATORY_CARE_PROVIDER_SITE_OTHER): Payer: Self-pay

## 2018-02-24 VITALS — BP 136/85 | HR 68 | Ht 73.0 in | Wt 250.0 lb

## 2018-02-24 DIAGNOSIS — M25562 Pain in left knee: Secondary | ICD-10-CM
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
## 2018-02-28 ENCOUNTER — Ambulatory Visit (HOSPITAL_BASED_OUTPATIENT_CLINIC_OR_DEPARTMENT_OTHER)
Admission: RE | Admit: 2018-02-28 | Discharge: 2018-02-28 | Disposition: A | Discharge status: Home / Self Care | Payer: BLUE CROSS/BLUE SHIELD | Source: Ambulatory Visit | Attending: Orthopaedic Surgery | Admitting: Orthopaedic Surgery

## 2018-02-28 DIAGNOSIS — M25562 Pain in left knee: Secondary | ICD-10-CM | POA: Diagnosis not present

## 2018-03-05 DIAGNOSIS — T8189XA Other complications of procedures, not elsewhere classified, initial encounter: Secondary | ICD-10-CM | POA: Diagnosis not present

## 2018-03-06 ENCOUNTER — Ambulatory Visit (INDEPENDENT_AMBULATORY_CARE_PROVIDER_SITE_OTHER): Payer: BLUE CROSS/BLUE SHIELD | Admitting: Orthopaedic Surgery

## 2018-03-09 ENCOUNTER — Encounter (INDEPENDENT_AMBULATORY_CARE_PROVIDER_SITE_OTHER): Payer: Self-pay | Admitting: Orthopaedic Surgery

## 2018-03-09 ENCOUNTER — Ambulatory Visit (INDEPENDENT_AMBULATORY_CARE_PROVIDER_SITE_OTHER): Payer: BLUE CROSS/BLUE SHIELD | Admitting: Orthopaedic Surgery

## 2018-03-09 VITALS — BP 140/93 | HR 80 | Ht 73.0 in | Wt 250.0 lb

## 2018-03-09 DIAGNOSIS — G8929 Other chronic pain: Secondary | ICD-10-CM

## 2018-03-09 DIAGNOSIS — M25562 Pain in left knee: Secondary | ICD-10-CM
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
## 2018-03-16 DIAGNOSIS — M9901 Segmental and somatic dysfunction of cervical region: Secondary | ICD-10-CM | POA: Diagnosis not present

## 2018-03-16 DIAGNOSIS — M9902 Segmental and somatic dysfunction of thoracic region: Secondary | ICD-10-CM | POA: Diagnosis not present

## 2018-04-14 DIAGNOSIS — N528 Other male erectile dysfunction: Secondary | ICD-10-CM | POA: Diagnosis not present

## 2018-05-14 DIAGNOSIS — G4733 Obstructive sleep apnea (adult) (pediatric): Secondary | ICD-10-CM | POA: Diagnosis not present

## 2018-05-20 DIAGNOSIS — M9901 Segmental and somatic dysfunction of cervical region: Secondary | ICD-10-CM | POA: Diagnosis not present

## 2018-05-20 DIAGNOSIS — M9902 Segmental and somatic dysfunction of thoracic region: Secondary | ICD-10-CM | POA: Diagnosis not present

## 2018-06-24 DIAGNOSIS — N23 Unspecified renal colic: Secondary | ICD-10-CM | POA: Diagnosis not present

## 2018-06-24 DIAGNOSIS — Z7982 Long term (current) use of aspirin: Secondary | ICD-10-CM | POA: Diagnosis not present

## 2018-06-24 DIAGNOSIS — N132 Hydronephrosis with renal and ureteral calculous obstruction: Secondary | ICD-10-CM | POA: Diagnosis not present

## 2018-06-24 DIAGNOSIS — R7989 Other specified abnormal findings of blood chemistry: Secondary | ICD-10-CM | POA: Diagnosis not present

## 2018-06-24 DIAGNOSIS — N390 Urinary tract infection, site not specified: Secondary | ICD-10-CM | POA: Diagnosis not present

## 2018-06-24 DIAGNOSIS — Z87891 Personal history of nicotine dependence: Secondary | ICD-10-CM | POA: Diagnosis not present

## 2018-06-24 DIAGNOSIS — N136 Pyonephrosis: Secondary | ICD-10-CM | POA: Diagnosis not present

## 2018-06-24 DIAGNOSIS — Z87442 Personal history of urinary calculi: Secondary | ICD-10-CM | POA: Diagnosis not present

## 2018-06-24 DIAGNOSIS — Z885 Allergy status to narcotic agent status: Secondary | ICD-10-CM | POA: Diagnosis not present

## 2018-06-24 DIAGNOSIS — K219 Gastro-esophageal reflux disease without esophagitis: Secondary | ICD-10-CM | POA: Diagnosis not present

## 2018-06-24 DIAGNOSIS — Z888 Allergy status to other drugs, medicaments and biological substances status: Secondary | ICD-10-CM | POA: Diagnosis not present

## 2018-06-24 DIAGNOSIS — N200 Calculus of kidney: Secondary | ICD-10-CM | POA: Diagnosis not present

## 2018-06-24 DIAGNOSIS — Z79899 Other long term (current) drug therapy: Secondary | ICD-10-CM | POA: Diagnosis not present

## 2018-06-24 DIAGNOSIS — N1330 Unspecified hydronephrosis: Secondary | ICD-10-CM | POA: Insufficient documentation

## 2018-06-24 DIAGNOSIS — R109 Unspecified abdominal pain: Secondary | ICD-10-CM | POA: Diagnosis not present

## 2018-06-24 DIAGNOSIS — R1114 Bilious vomiting: Secondary | ICD-10-CM | POA: Diagnosis not present

## 2018-06-25 DIAGNOSIS — R1114 Bilious vomiting: Secondary | ICD-10-CM | POA: Diagnosis not present

## 2018-06-25 DIAGNOSIS — N1330 Unspecified hydronephrosis: Secondary | ICD-10-CM | POA: Diagnosis not present

## 2018-06-25 DIAGNOSIS — N200 Calculus of kidney: Secondary | ICD-10-CM | POA: Diagnosis not present

## 2018-06-25 DIAGNOSIS — N23 Unspecified renal colic: Secondary | ICD-10-CM | POA: Diagnosis not present

## 2018-07-01 ENCOUNTER — Other Ambulatory Visit: Payer: Self-pay | Admitting: Family Medicine

## 2018-07-16 DIAGNOSIS — Z01812 Encounter for preprocedural laboratory examination: Secondary | ICD-10-CM | POA: Diagnosis not present

## 2018-07-16 DIAGNOSIS — Z01810 Encounter for preprocedural cardiovascular examination: Secondary | ICD-10-CM | POA: Diagnosis not present

## 2018-07-16 DIAGNOSIS — T83420A Displacement of penile (implanted) prosthesis, initial encounter: Secondary | ICD-10-CM | POA: Diagnosis not present

## 2018-07-16 DIAGNOSIS — X58XXXA Exposure to other specified factors, initial encounter: Secondary | ICD-10-CM | POA: Diagnosis not present

## 2018-07-16 DIAGNOSIS — Z1159 Encounter for screening for other viral diseases: Secondary | ICD-10-CM | POA: Diagnosis not present

## 2018-07-20 DIAGNOSIS — I493 Ventricular premature depolarization: Secondary | ICD-10-CM | POA: Diagnosis not present

## 2018-07-22 DIAGNOSIS — T8389XA Other specified complication of genitourinary prosthetic devices, implants and grafts, initial encounter: Secondary | ICD-10-CM | POA: Diagnosis not present

## 2018-07-22 DIAGNOSIS — N529 Male erectile dysfunction, unspecified: Secondary | ICD-10-CM | POA: Diagnosis not present

## 2018-07-22 DIAGNOSIS — Y839 Surgical procedure, unspecified as the cause of abnormal reaction of the patient, or of later complication, without mention of misadventure at the time of the procedure: Secondary | ICD-10-CM | POA: Diagnosis not present

## 2018-07-22 DIAGNOSIS — N401 Enlarged prostate with lower urinary tract symptoms: Secondary | ICD-10-CM | POA: Diagnosis not present

## 2018-07-22 DIAGNOSIS — T83420D Displacement of penile (implanted) prosthesis, subsequent encounter: Secondary | ICD-10-CM | POA: Diagnosis not present

## 2018-07-23 DIAGNOSIS — N529 Male erectile dysfunction, unspecified: Secondary | ICD-10-CM | POA: Insufficient documentation

## 2018-07-23 DIAGNOSIS — Y839 Surgical procedure, unspecified as the cause of abnormal reaction of the patient, or of later complication, without mention of misadventure at the time of the procedure: Secondary | ICD-10-CM | POA: Diagnosis not present

## 2018-07-23 DIAGNOSIS — N401 Enlarged prostate with lower urinary tract symptoms: Secondary | ICD-10-CM | POA: Diagnosis not present

## 2018-07-23 DIAGNOSIS — T8389XA Other specified complication of genitourinary prosthetic devices, implants and grafts, initial encounter: Secondary | ICD-10-CM | POA: Diagnosis not present

## 2018-07-30 DIAGNOSIS — T83420D Displacement of penile (implanted) prosthesis, subsequent encounter: Secondary | ICD-10-CM | POA: Diagnosis not present

## 2018-08-03 ENCOUNTER — Telehealth: Payer: Self-pay

## 2018-08-03 MED ORDER — OMEPRAZOLE 40 MG PO CPDR: DELAYED_RELEASE_CAPSULE | ORAL | 0 refills | Status: DC

## 2018-08-03 NOTE — Telephone Encounter (Signed)
Copied from [place] [id number]. Topic: Appointment Scheduling - Scheduling Inquiry for Clinic >> Aug 03, 2018  1:07 PM Edmonia Caprio wrote: Reason for CRM:  Patient walked in to clinic requesting an appt for med follow up on his Prilosec. Patient scheduled appt for Wed 08/05/18. If at possible, could provider call in 3 days of Prilosec to [facility]  Okay to fill until appt on 7/15? Last refill 07/01/18 (60,0)

## 2018-08-03 NOTE — Telephone Encounter (Signed)
1 mo supply prilosec eRx'd.

## 2018-08-03 NOTE — Telephone Encounter (Signed)
Pt was notified that med was sent. 

## 2018-08-05 ENCOUNTER — Ambulatory Visit (INDEPENDENT_AMBULATORY_CARE_PROVIDER_SITE_OTHER): Payer: BC Managed Care – PPO | Admitting: Family Medicine

## 2018-08-05 ENCOUNTER — Other Ambulatory Visit: Payer: Self-pay

## 2018-08-05 ENCOUNTER — Encounter: Payer: Self-pay | Admitting: Family Medicine

## 2018-08-05 VITALS — BP 114/70 | HR 66 | Temp 98.3°F | Resp 16 | Ht 73.0 in | Wt 249.8 lb

## 2018-08-05 DIAGNOSIS — H029 Unspecified disorder of eyelid: Secondary | ICD-10-CM

## 2018-08-05 DIAGNOSIS — K635 Polyp of colon: Secondary | ICD-10-CM

## 2018-08-05 DIAGNOSIS — K219 Gastro-esophageal reflux disease without esophagitis: Secondary | ICD-10-CM

## 2018-08-05 MED ORDER — OMEPRAZOLE 40 MG PO CPDR: DELAYED_RELEASE_CAPSULE | ORAL | 3 refills | Status: DC
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
## 2018-08-07 ENCOUNTER — Encounter: Payer: Self-pay | Admitting: Family Medicine

## 2018-08-07 DIAGNOSIS — H0100B Unspecified blepharitis left eye, upper and lower eyelids: Secondary | ICD-10-CM | POA: Diagnosis not present

## 2018-08-07 DIAGNOSIS — D489 Neoplasm of uncertain behavior, unspecified: Secondary | ICD-10-CM | POA: Diagnosis not present

## 2018-08-07 DIAGNOSIS — H0100A Unspecified blepharitis right eye, upper and lower eyelids: Secondary | ICD-10-CM | POA: Diagnosis not present

## 2018-08-07 DIAGNOSIS — H2513 Age-related nuclear cataract, bilateral: Secondary | ICD-10-CM | POA: Diagnosis not present

## 2018-08-14 DIAGNOSIS — G4733 Obstructive sleep apnea (adult) (pediatric): Secondary | ICD-10-CM | POA: Diagnosis not present

## 2018-09-14 DIAGNOSIS — M9902 Segmental and somatic dysfunction of thoracic region: Secondary | ICD-10-CM | POA: Diagnosis not present

## 2018-09-14 DIAGNOSIS — M9901 Segmental and somatic dysfunction of cervical region: Secondary | ICD-10-CM | POA: Diagnosis not present

## 2018-09-14 DIAGNOSIS — M9903 Segmental and somatic dysfunction of lumbar region: Secondary | ICD-10-CM | POA: Diagnosis not present

## 2018-09-14 DIAGNOSIS — M9904 Segmental and somatic dysfunction of sacral region: Secondary | ICD-10-CM | POA: Diagnosis not present

## 2018-09-17 DIAGNOSIS — N138 Other obstructive and reflux uropathy: Secondary | ICD-10-CM | POA: Diagnosis not present

## 2018-09-17 DIAGNOSIS — N401 Enlarged prostate with lower urinary tract symptoms: Secondary | ICD-10-CM | POA: Diagnosis not present

## 2018-11-17 DIAGNOSIS — G4733 Obstructive sleep apnea (adult) (pediatric): Secondary | ICD-10-CM | POA: Diagnosis not present

## 2018-11-19 ENCOUNTER — Encounter: Payer: Self-pay | Admitting: Orthopaedic Surgery

## 2018-11-19 ENCOUNTER — Other Ambulatory Visit: Payer: Self-pay

## 2018-11-19 ENCOUNTER — Ambulatory Visit (INDEPENDENT_AMBULATORY_CARE_PROVIDER_SITE_OTHER): Payer: BC Managed Care – PPO | Admitting: Orthopaedic Surgery

## 2018-11-19 DIAGNOSIS — M7711 Lateral epicondylitis, right elbow: Secondary | ICD-10-CM

## 2018-11-19 MED ORDER — METHYLPREDNISOLONE ACETATE 40 MG/ML IJ SUSP
40.0000 mg | INTRAMUSCULAR | Status: AC | PRN
  Administered 2018-11-19: 40 mg

## 2018-11-19 MED ORDER — LIDOCAINE HCL 1 % IJ SOLN
1.0000 mL | INTRAMUSCULAR | Status: AC | PRN
  Administered 2018-11-19: 1 mL
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
## 2018-11-25 DIAGNOSIS — M9904 Segmental and somatic dysfunction of sacral region: Secondary | ICD-10-CM | POA: Diagnosis not present

## 2018-11-25 DIAGNOSIS — M9901 Segmental and somatic dysfunction of cervical region: Secondary | ICD-10-CM | POA: Diagnosis not present

## 2018-11-25 DIAGNOSIS — M9903 Segmental and somatic dysfunction of lumbar region: Secondary | ICD-10-CM | POA: Diagnosis not present

## 2018-11-25 DIAGNOSIS — M9902 Segmental and somatic dysfunction of thoracic region: Secondary | ICD-10-CM | POA: Diagnosis not present

## 2019-02-22 ENCOUNTER — Ambulatory Visit (INDEPENDENT_AMBULATORY_CARE_PROVIDER_SITE_OTHER): Payer: 59 | Admitting: Family Medicine

## 2019-02-22 ENCOUNTER — Encounter: Payer: Self-pay | Admitting: Family Medicine

## 2019-02-22 ENCOUNTER — Other Ambulatory Visit: Payer: Self-pay

## 2019-02-22 ENCOUNTER — Telehealth (HOSPITAL_COMMUNITY): Payer: Self-pay | Admitting: *Deleted

## 2019-02-22 VITALS — BP 142/102 | HR 66 | Temp 98.4°F | Resp 16 | Ht 73.0 in | Wt 257.8 lb

## 2019-02-22 DIAGNOSIS — F411 Generalized anxiety disorder: Secondary | ICD-10-CM | POA: Diagnosis not present

## 2019-02-22 DIAGNOSIS — G4700 Insomnia, unspecified: Secondary | ICD-10-CM | POA: Diagnosis not present

## 2019-02-22 DIAGNOSIS — I10 Essential (primary) hypertension: Secondary | ICD-10-CM

## 2019-02-22 DIAGNOSIS — R079 Chest pain, unspecified: Secondary | ICD-10-CM

## 2019-02-22 LAB — TSH: TSH: 2.74 u[IU]/mL (ref 0.35–4.50)

## 2019-02-22 LAB — COMPREHENSIVE METABOLIC PANEL
ALT: 37 U/L (ref 0–53)
AST: 26 U/L (ref 0–37)
Albumin: 4.7 g/dL (ref 3.5–5.2)
Alkaline Phosphatase: 83 U/L (ref 39–117)
BUN: 14 mg/dL (ref 6–23)
CO2: 28 mEq/L (ref 19–32)
Calcium: 9.6 mg/dL (ref 8.4–10.5)
Chloride: 102 mEq/L (ref 96–112)
Creatinine, Ser: 1.07 mg/dL (ref 0.40–1.50)
GFR: 72.87 mL/min (ref >60.00)
Glucose, Bld: 95 mg/dL (ref 70–99)
Potassium: 4.3 mEq/L (ref 3.5–5.1)
Sodium: 138 mEq/L (ref 135–145)
Total Bilirubin: 0.3 mg/dL (ref 0.2–1.2)
Total Protein: 7.4 g/dL (ref 6.0–8.3)

## 2019-02-22 LAB — CBC WITH DIFFERENTIAL/PLATELET
Basophils Absolute: 0 10*3/uL (ref 0.0–0.1)
Basophils Relative: 0.7 % (ref 0.0–3.0)
Eosinophils Absolute: 0.1 10*3/uL (ref 0.0–0.7)
Eosinophils Relative: 1.8 % (ref 0.0–5.0)
HCT: 42.3 % (ref 39.0–52.0)
Hemoglobin: 14.4 g/dL (ref 13.0–17.0)
Lymphocytes Relative: 28.9 % (ref 12.0–46.0)
Lymphs Abs: 1.7 10*3/uL (ref 0.7–4.0)
MCHC: 34 g/dL (ref 30.0–36.0)
MCV: 90.9 fl (ref 78.0–100.0)
Monocytes Absolute: 0.4 10*3/uL (ref 0.1–1.0)
Monocytes Relative: 7.1 % (ref 3.0–12.0)
Neutro Abs: 3.5 10*3/uL (ref 1.4–7.7)
Neutrophils Relative %: 61.5 % (ref 43.0–77.0)
Platelets: 316 10*3/uL (ref 150.0–400.0)
RBC: 4.66 Mil/uL (ref 4.22–5.81)
RDW: 13.2 % (ref 11.5–15.5)
WBC: 5.8 10*3/uL (ref 4.0–10.5)

## 2019-02-22 LAB — LIPID PANEL
Cholesterol: 168 mg/dL (ref 0–200)
HDL: 32.2 mg/dL — ABNORMAL LOW (ref >39.00)
LDL Cholesterol: 105 mg/dL — ABNORMAL HIGH (ref 0–99)
NonHDL: 135.73
Total CHOL/HDL Ratio: 5
Triglycerides: 156 mg/dL — ABNORMAL HIGH (ref 0.0–149.0)
VLDL: 31.2 mg/dL (ref 0.0–40.0)

## 2019-02-22 MED ORDER — LORAZEPAM 0.5 MG PO TABS: ORAL_TABLET | ORAL | 1 refills | Status: DC

## 2019-02-22 MED ORDER — ASPIRIN EC 81 MG PO TBEC: 81.0000 mg | DELAYED_RELEASE_TABLET | Freq: Every day | ORAL | 0 refills | Status: DC

## 2019-02-22 MED ORDER — METOPROLOL SUCCINATE ER 25 MG PO TB24: 25.0000 mg | ORAL_TABLET | Freq: Every day | ORAL | 1 refills | Status: DC

## 2019-02-22 MED ORDER — ESCITALOPRAM OXALATE 10 MG PO TABS: 10.0000 mg | ORAL_TABLET | Freq: Every day | ORAL | 1 refills | Status: DC
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
## 2019-02-22 NOTE — Patient Instructions (Signed)
Start taking one over the counter Aspirin (81 mg) once daily.

## 2019-02-22 NOTE — Telephone Encounter (Signed)
Patient given detailed instructions per Myocardial Perfusion Study Information Sheet for the test on 02/25/19. Patient notified to arrive 15 minutes early and that it is imperative to arrive on time for appointment to keep from having the test rescheduled.  If you need to cancel or reschedule your appointment, please call the office within 24 hours of your appointment. . Patient verbalized understanding. Ricky Ala

## 2019-02-24 ENCOUNTER — Other Ambulatory Visit: Payer: Self-pay

## 2019-02-24 ENCOUNTER — Ambulatory Visit (HOSPITAL_BASED_OUTPATIENT_CLINIC_OR_DEPARTMENT_OTHER)
Admission: RE | Admit: 2019-02-24 | Discharge: 2019-02-24 | Disposition: A | Discharge status: Home / Self Care | Payer: 59 | Source: Ambulatory Visit | Attending: Family Medicine | Admitting: Family Medicine

## 2019-02-24 DIAGNOSIS — R079 Chest pain, unspecified: Secondary | ICD-10-CM | POA: Insufficient documentation

## 2019-02-25 ENCOUNTER — Encounter (HOSPITAL_COMMUNITY): Payer: 59

## 2019-03-01 ENCOUNTER — Telehealth: Payer: Self-pay

## 2019-03-01 NOTE — Telephone Encounter (Signed)
Patient's nuclear myocardial perfusion test was declined by his insurance. Patient was advised by his insurance that they will approve a "bicycle test" however the patient was advised he would have to quarantine for 4 days.He states he cannot quarantine that long. I have left a message for cardiology to call back to clarify.

## 2019-03-02 NOTE — Telephone Encounter (Signed)
Spoke with patient regarding his ordered Nuclear Stress Test. Explained that we were waiting on insurance approval to reschedule.  Also explained if the test got changed to a treadmill or other exercise stress test modality, the patient would need to be scheduled for a Covid test and quarantine prior to the test. Patient expressed his understanding.  Ricky Ala, RN

## 2019-03-22 ENCOUNTER — Ambulatory Visit: Payer: 59 | Admitting: Family Medicine

## 2019-03-22 DIAGNOSIS — Z0289 Encounter for other administrative examinations: Secondary | ICD-10-CM
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
## 2019-04-22 ENCOUNTER — Other Ambulatory Visit: Payer: Self-pay | Admitting: Family Medicine

## 2019-04-26 NOTE — Telephone Encounter (Signed)
RF request for Lexapro LOV:02/22/19 Next ov: advised to f/u around March 2021 Last written:02/22/19(30,1)  RF request for Metoprolol LOV:02/22/19 Next ov: advised to f/u around March 2021 Last written:02/22/19(30,1)  Requesting: Lorazepam Contract:n/a UDS:n/a Last Visit:02/22/19 Next Visit:advised to f/u around March 2021 Last Refill:02/22/19(30,1)  Please Advise. Medications pending

## 2019-04-26 NOTE — Telephone Encounter (Signed)
Pt did not f/u as instructed. I started all 3 of these meds NEW at last o/v.  I sent in #15 of each of these meds today but he needs office f/u prior to any further meds.-thx

## 2019-04-27 NOTE — Telephone Encounter (Signed)
Tried to contact pt/pt's wife regarding med refills and scheduling next f/u. Both numbers did not go through as currently active.

## 2019-04-30 NOTE — Telephone Encounter (Signed)
Letter will be mailed to patient regarding scheduling an appointment for further refills.

## 2019-05-13 ENCOUNTER — Other Ambulatory Visit: Payer: Self-pay

## 2019-05-13 MED ORDER — LORAZEPAM 0.5 MG PO TABS: ORAL_TABLET | ORAL | 0 refills | Status: DC

## 2019-05-13 MED ORDER — METOPROLOL SUCCINATE ER 25 MG PO TB24: 25.0000 mg | ORAL_TABLET | Freq: Every day | ORAL | 0 refills | Status: DC

## 2019-05-13 NOTE — Telephone Encounter (Signed)
Patient walked to make an appt to see Dr. Milinda Cave to get refill his medications He did not give me the name of the meds. He said one was for his blood pressure and the other one was to help him sleep.  I scheduled patient an appt for Monday, April 26th with Dr. Milinda Cave.  Patient requested meds to be sent into pharmacy prior to appt   [facility] - [place], [place] - [address]

## 2019-05-13 NOTE — Telephone Encounter (Signed)
RF request for Metoprolol LOV:02/22/19 Next ov: 05/17/19 Last written:04/26/19(15,0)  Requesting: Lorazepam Contract:n/a UDS:n/a Last Visit:02/22/19 Next Visit:05/17/19 Last Refill:04/26/19(15,0)  Medications pending, please advise.

## 2019-05-13 NOTE — Telephone Encounter (Signed)
LMOM for pt to CB.    Uncomfortable leaving a detailed message on work phone number.

## 2019-05-13 NOTE — Telephone Encounter (Signed)
I'll do #15 lorazepam and 1 mo supply of metoprolol. Pt did not f/u as instructed after his February appt with me. Needs f/u before any FURTHER rx fills of these meds.-thx

## 2019-05-13 NOTE — Telephone Encounter (Signed)
Patient called back, advised to keep his follow up appointment.

## 2019-05-14 ENCOUNTER — Other Ambulatory Visit: Payer: Self-pay

## 2019-05-17 ENCOUNTER — Encounter: Payer: Self-pay | Admitting: Family Medicine

## 2019-05-17 ENCOUNTER — Other Ambulatory Visit: Payer: Self-pay

## 2019-05-17 ENCOUNTER — Ambulatory Visit (INDEPENDENT_AMBULATORY_CARE_PROVIDER_SITE_OTHER): Payer: 59 | Admitting: Family Medicine

## 2019-05-17 VITALS — BP 130/82 | HR 65 | Temp 98.1°F | Resp 16 | Ht 73.0 in | Wt 259.2 lb

## 2019-05-17 DIAGNOSIS — I10 Essential (primary) hypertension: Secondary | ICD-10-CM

## 2019-05-17 DIAGNOSIS — R519 Headache, unspecified: Secondary | ICD-10-CM | POA: Diagnosis not present

## 2019-05-17 DIAGNOSIS — R0789 Other chest pain: Secondary | ICD-10-CM | POA: Diagnosis not present

## 2019-05-17 DIAGNOSIS — F411 Generalized anxiety disorder: Secondary | ICD-10-CM | POA: Diagnosis not present

## 2019-05-17 DIAGNOSIS — G4700 Insomnia, unspecified: Secondary | ICD-10-CM

## 2019-05-17 MED ORDER — LORAZEPAM 1 MG PO TABS: 1.0000 mg | ORAL_TABLET | Freq: Two times a day (BID) | ORAL | 1 refills | Status: DC | PRN

## 2019-05-17 MED ORDER — ESCITALOPRAM OXALATE 20 MG PO TABS: ORAL_TABLET | ORAL | 1 refills | Status: DC

## 2019-05-17 MED ORDER — OMEPRAZOLE 40 MG PO CPDR: DELAYED_RELEASE_CAPSULE | ORAL | 0 refills | Status: DC

## 2019-05-17 NOTE — Progress Notes (Signed)
CC:  Spencer Harrington is a 49 year old man with a PMH of HTN and GAD who presents today for a 2 month follow up on new meds for anxiety and blood pressure control A/P as of last visit: "A/P of last visit:  "1) Chest pain: some typical and some atypical features.  EKG today reassuring. CBC, CMET, FLP, TSH. CXR ordered. Myocardial perfusion imaging ordered. Start 81mg  ASA qd today.  2) HTN: needs to start med-->toprol xl 25mg  qd and see how he tolerates this.  3) GAD with signif sleep impairment:  Start lexapro 10mg  qd. Also lorazepam 0.5mg , 1-2 bid prn, #30, RF x 1. If pt stays on this long term then we'll get CSC and UDS."   Interim hx:  Mood and worry/stress-> Wishes he had stronger dose at night  Doesn't feel like he really needs it during the day but more so at night to help him sleep. VERY long hx of sleep dysfunction.  Wears Cpap faithfully.    BP at office? 130/82  BP at home? Not been checking at home ; has not taken toprol in 2-3 days. No adverse side effects from toprol.  Atypical CP - was supposed to get a test but did not  -had 2 episodes of pain since last visit  -1.5 wk ago last episode: sharp pain on your right side of chest localized, no N/V/C/D, no muscular trauma, "feels random" started in middle of day , 3/10  -hx of reflux ; takes prilosec for it  -a month ago, had chest pain center of chest, sharp, did not spread, N/V/C/D, started at rest (not while doing activity), had headache , lasts a few minutes and goes away   Having headaches about every day - in AM in back of head, throbbing, 2-3/10, mainly during day  -worse when taking cialis  -Goodys powder 2x.day; 2 at once avg of 4 days per week. -doesn't feel any better or worse with lexapro/ativan   Chest pain occurring less often with lexapro/ativan  Sex drive is "basically gone" --many years hx of this.  ROS: no fevers, no SOB, no wheezing, no cough, no dizziness,no rashes, no melena/hematochezia.   No polyuria or polydipsia.  No myalgias or arthralgias.  No focal weakness, paresthesias, or tremors.  No acute vision or hearing abnormalities. No n/v/d or abd pain.  No palpitations.      PMH: Past Medical History:  Diagnosis Date  . Acute pain of left knee 12/2017   Steroid injection by ortho.  MRI with partial tear of lat menisc (pt not symptomatic laterally).  Some DJD present medially with partial thickness chondral defect medially-->observe + NSAIDs.  Displacement of penile prosthesis implant (HCC) 07/2017   Revision was done.  01/2018 GERD (gastroesophageal reflux disease)    [facility] [place] --w/u for biliary reflux unremarkable per pt report 05/2017.  Marland Kitchen History of adenomatous polyp of colon 2019   next colonoscopy 2024.  Marland Kitchen Insomnia   . Kidney stones   . Nephrolithiasis    remote past (no procedure has been required to remove stone).  Most recent episode 09/2015  . Obesity, Class II, BMI 35-39.9   . Tobacco dependence in remission    Quit 2005    M/A: Current Outpatient Medications on File Prior to Visit  Medication Sig Dispense Refill  . escitalopram (LEXAPRO) 10 MG tablet TAKE 1 TABLET BY MOUTH DAILY. 15 tablet 0  . LORazepam (ATIVAN) 0.5 MG tablet TAKE 1-2 TABLETS  BY MOUTH TWO TIMES DAILY AS NEEDED FOR ANXIETY 15 tablet 0  . metoprolol succinate (TOPROL-XL) 25 MG 24 hr tablet Take 1 tablet (25 mg total) by mouth daily. 30 tablet 0  . omeprazole (PRILOSEC) 40 MG capsule TAKE 1 CAPSULE BY MOUTH TWICE A DAY 180 capsule 3  . aspirin EC 81 MG tablet Take 1 tablet (81 mg total) by mouth daily. (Patient not taking: Reported on 05/17/2019) 30 tablet 0  . tadalafil (CIALIS) 5 MG tablet Take 5 mg by mouth daily.     No current facility-administered medications on file prior to visit.   Allergies  Allergen Reactions  . Ambien [Zolpidem Tartrate] Other (See Comments)    Bad dreams  . Morphine Nausea And Vomiting    FH: Family History  Problem  Relation Age of Onset  . Cancer Mother        pancreatic  . Alcohol abuse Sister     SH: Social History   Socioeconomic History  . Marital status: Single    Spouse name: Not on file  . Number of children: Not on file  . Years of education: Not on file  . Highest education level: Not on file  Occupational History  . Not on file  Tobacco Use  . Smoking status: Former Smoker    Packs/day: 1.50    Years: 20.00    Pack years: 30.00    Types: Cigarettes    Quit date: 2004    Years since quitting: 17.3  . Smokeless tobacco: Never Used  Substance and Sexual Activity  . Alcohol use: Yes    Comment: 2 YEARLY - RARELY  . Drug use: No  . Sexual activity: Not on file  Other Topics Concern  . Not on file  Social History Narrative   Married, no children.   Orig from [place], [place].   Occupation: Owns a tree removal company.   Former smoker: 20 pack/yr hx, quit 2005.   Alcohol: occasional beer.   Drugs: none   Social Determinants of Health   Financial Resource Strain:   . Difficulty of Paying Living Expenses:   Food Insecurity:   . Worried About Programme researcher, broadcasting/film/video in the Last Year:   . Barista in the Last Year:   Transportation Needs:   . Freight forwarder (Medical):   Marland Kitchen Lack of Transportation (Non-Medical):   Physical Activity:   . Days of Exercise per Week:   . Minutes of Exercise per Session:   Stress:   . Feeling of Stress :   Social Connections:   . Frequency of Communication with Friends and Family:   . Frequency of Social Gatherings with Friends and Family:   . Attends Religious Services:   . Active Member of Clubs or Organizations:   . Attends Banker Meetings:   Marland Kitchen Marital Status:     ROS: Review of Systems  Constitutional: Negative.   HENT: Negative.   Eyes: Negative.   Respiratory: Negative.   Cardiovascular: Positive for chest pain and palpitations. Negative for orthopnea, claudication, leg swelling and PND.  Gastrointestinal:  Positive for heartburn. Negative for abdominal pain, blood in stool, constipation, diarrhea, melena, nausea and vomiting.  Genitourinary: Negative.   Musculoskeletal: Negative.   Skin: Negative.   Neurological: Positive for headaches. Negative for dizziness, tingling, tremors, sensory change, speech change, focal weakness, seizures, loss of consciousness and weakness.  Endo/Heme/Allergies: Negative.   Psychiatric/Behavioral: Negative for depression, hallucinations, memory loss, substance abuse and suicidal  ideas. The patient is nervous/anxious and has insomnia.     PE: Vitals with BMI 05/17/2019 02/22/2019 08/05/2018  Height 6\' 1"  6\' 1"  6\' 1"   Weight 259 lbs 3 oz 257 lbs 13 oz 249 lbs 13 oz  BMI 34.2 63.01 60.10  Systolic 932 355 732  Diastolic 82 202 70  Pulse 65 66 66    Physical Exam  Constitutional: He is oriented to person, place, and time and well-developed, well-nourished, and in no distress. No distress.  HENT:  Head: Normocephalic and atraumatic.  Right Ear: External ear normal.  Left Ear: External ear normal.  Nose: Nose normal.  Mouth/Throat: Oropharynx is clear and moist. No oropharyngeal exudate.  Eyes: Pupils are equal, round, and reactive to light. Conjunctivae and EOM are normal. Right eye exhibits no discharge. Left eye exhibits no discharge. No scleral icterus.  Neck: No JVD present. No tracheal deviation present. No thyromegaly present.  Cardiovascular: Normal rate, regular rhythm and intact distal pulses. Exam reveals no gallop and no friction rub.  No murmur heard. Slightly distant heart sounds  Pulmonary/Chest: Effort normal and breath sounds normal. No stridor. No respiratory distress. He has no wheezes. He has no rales. He exhibits no tenderness.  Musculoskeletal:        General: No edema.     Cervical back: Normal range of motion and neck supple.  Lymphadenopathy:    He has no cervical adenopathy.  Neurological: He is alert and oriented to person, place, and  time. No cranial nerve deficit. GCS score is 15.  Skin: Skin is warm and dry. No rash noted. He is not diaphoretic. No erythema. No pallor.  Psychiatric: Mood, memory, affect and judgment normal.    Labs: Recent Results (from the past 2160 hour(s))  CBC with Differential/Platelet     Status: None   Collection Time: 02/22/19 11:36 AM  Result Value Ref Range   WBC 5.8 4.0 - 10.5 K/uL   RBC 4.66 4.22 - 5.81 Mil/uL   Hemoglobin 14.4 13.0 - 17.0 g/dL   HCT 42.3 39.0 - 52.0 %   MCV 90.9 78.0 - 100.0 fl   MCHC 34.0 30.0 - 36.0 g/dL   RDW 13.2 11.5 - 15.5 %   Platelets 316.0 150.0 - 400.0 K/uL   Neutrophils Relative % 61.5 43.0 - 77.0 %   Lymphocytes Relative 28.9 12.0 - 46.0 %   Monocytes Relative 7.1 3.0 - 12.0 %   Eosinophils Relative 1.8 0.0 - 5.0 %   Basophils Relative 0.7 0.0 - 3.0 %   Neutro Abs 3.5 1.4 - 7.7 K/uL   Lymphs Abs 1.7 0.7 - 4.0 K/uL   Monocytes Absolute 0.4 0.1 - 1.0 K/uL   Eosinophils Absolute 0.1 0.0 - 0.7 K/uL   Basophils Absolute 0.0 0.0 - 0.1 K/uL  TSH     Status: None   Collection Time: 02/22/19 11:36 AM  Result Value Ref Range   TSH 2.74 0.35 - 4.50 uIU/mL  Lipid panel     Status: Abnormal   Collection Time: 02/22/19 11:36 AM  Result Value Ref Range   Cholesterol 168 0 - 200 mg/dL    Comment: ATP III Classification       Desirable:  < 200 mg/dL               Borderline High:  200 - 239 mg/dL          High:  > = 240 mg/dL   Triglycerides 156.0 (H) 0.0 - 149.0 mg/dL  Comment: Normal:  <150 mg/dLBorderline High:  150 - 199 mg/dL   HDL 38.75 (L) >64.33 mg/dL   VLDL 29.5 0.0 - 18.8 mg/dL   LDL Cholesterol 416 (H) 0 - 99 mg/dL   Total CHOL/HDL Ratio 5     Comment:                Men          Women1/2 Average Risk     3.4          3.3Average Risk          5.0          4.42X Average Risk          9.6          7.13X Average Risk          15.0          11.0                       NonHDL 135.73     Comment: NOTE:  Non-HDL goal should be 30 mg/dL higher than  patient's LDL goal (i.e. LDL goal of < 70 mg/dL, would have non-HDL goal of < 100 mg/dL)  Comprehensive metabolic panel     Status: None   Collection Time: 02/22/19 11:36 AM  Result Value Ref Range   Sodium 138 135 - 145 mEq/L   Potassium 4.3 3.5 - 5.1 mEq/L   Chloride 102 96 - 112 mEq/L   CO2 28 19 - 32 mEq/L   Glucose, Bld 95 70 - 99 mg/dL   BUN 14 6 - 23 mg/dL   Creatinine, Ser 6.06 0.40 - 1.50 mg/dL   Total Bilirubin 0.3 0.2 - 1.2 mg/dL   Alkaline Phosphatase 83 39 - 117 U/L   AST 26 0 - 37 U/L   ALT 37 0 - 53 U/L   Total Protein 7.4 6.0 - 8.3 g/dL   Albumin 4.7 3.5 - 5.2 g/dL   GFR 30.16 >01.09 mL/min   Calcium 9.6 8.4 - 10.5 mg/dL     A/P: In summary,Spencer Harrington is a 49 year old man with a past medical history of HTN, anxiety, GERD, and ED who presents for follow up on BP and mood management. On physical exam, he is unremarkable.  My plan is : 1) HTN: restart and continue toprol xl 25mg  qd. 2) Anxiety/Mood: some improvement noted. Double dose of ativan from 0.5mg  to 1mg  po qhs and lexapro from 10mg  to 20 mg po qhs.  May also take 1 mg ativan in daytime prn. 3) GERD: continue meds as indicated  4) Headaches: discussed possibility of med rebound HAs with pt today.  Recommended he cut Goodys powder to 2x/wk or fewer.  Hopefully getting his anxiety and sleep improved with lexapro and lorazepam changes today will help HAs as well. Lifestyle modifications (exercise and diet) to help with headaches, HTN, GERD, and mood 5) Atypical CP: suspect musculoskeletal etiology.   Signs/symptoms to call or return for were reviewed and pt expressed understanding.  Signed: , MS3 17 May 2019  I personally was present during the history, physical exam, and medical decision-making activities of this service and have verified that the service and findings are accurately documented in the student's note. Signed:  , MD           05/17/2019

## 2019-05-17 NOTE — Progress Notes (Signed)
See med student note from same date. Signed:  Phil Finas Delone, MD           05/17/2019  

## 2019-06-16 ENCOUNTER — Other Ambulatory Visit: Payer: Self-pay

## 2019-06-16 ENCOUNTER — Ambulatory Visit (INDEPENDENT_AMBULATORY_CARE_PROVIDER_SITE_OTHER): Payer: 59 | Admitting: Family Medicine

## 2019-06-16 ENCOUNTER — Other Ambulatory Visit: Payer: Self-pay | Admitting: Family Medicine

## 2019-06-16 ENCOUNTER — Encounter: Payer: Self-pay | Admitting: Family Medicine

## 2019-06-16 VITALS — BP 139/93 | HR 74 | Temp 97.6°F | Resp 16 | Ht 73.0 in | Wt 256.0 lb

## 2019-06-16 DIAGNOSIS — F329 Major depressive disorder, single episode, unspecified: Secondary | ICD-10-CM

## 2019-06-16 DIAGNOSIS — F5105 Insomnia due to other mental disorder: Secondary | ICD-10-CM | POA: Diagnosis not present

## 2019-06-16 DIAGNOSIS — G4489 Other headache syndrome: Secondary | ICD-10-CM | POA: Diagnosis not present

## 2019-06-16 DIAGNOSIS — F32A Depression, unspecified: Secondary | ICD-10-CM

## 2019-06-16 DIAGNOSIS — R0789 Other chest pain: Secondary | ICD-10-CM

## 2019-06-16 DIAGNOSIS — I10 Essential (primary) hypertension: Secondary | ICD-10-CM

## 2019-06-16 DIAGNOSIS — F99 Mental disorder, not otherwise specified: Secondary | ICD-10-CM

## 2019-06-16 DIAGNOSIS — F419 Anxiety disorder, unspecified: Secondary | ICD-10-CM

## 2019-06-16 MED ORDER — METOPROLOL SUCCINATE ER 25 MG PO TB24: 25.0000 mg | ORAL_TABLET | Freq: Every day | ORAL | 1 refills | Status: DC

## 2019-06-16 MED ORDER — ALPRAZOLAM 1 MG PO TABS: ORAL_TABLET | ORAL | 1 refills | Status: DC
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
## 2019-06-16 NOTE — Telephone Encounter (Signed)
Appt scheduled for today.

## 2019-06-16 NOTE — Patient Instructions (Signed)
Buy a bp machine at any pharmacy (upper arm cuff, not a wrist cuff). Check your blood pressure and heart rate once every day. Normal blood pressure is <130 on top number and <80 on bottom number. Normal heart rate is 60-100/min.  Write down all your blood pressure and heart rate readings and bring them with you to next visit in 1 month. Call prior to the next visit IF top number is greater than 160 or bottom number >100 on 3 consecutive days.

## 2019-07-02 DIAGNOSIS — N201 Calculus of ureter: Secondary | ICD-10-CM | POA: Insufficient documentation

## 2019-07-14 ENCOUNTER — Ambulatory Visit: Payer: 59 | Admitting: Family Medicine

## 2019-07-14 DIAGNOSIS — Z0289 Encounter for other administrative examinations: Secondary | ICD-10-CM
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
## 2019-08-16 ENCOUNTER — Other Ambulatory Visit: Payer: Self-pay

## 2019-08-16 MED ORDER — OMEPRAZOLE 40 MG PO CPDR: DELAYED_RELEASE_CAPSULE | ORAL | 0 refills | Status: AC

## 2019-08-24 ENCOUNTER — Telehealth: Payer: Self-pay

## 2019-08-24 NOTE — Telephone Encounter (Signed)
Patient walked into office to schedule appt with Dr. Milinda Cave. He was hoping that we would have availability today, but I told him today was Dr. Samul Dada half day, last appt was at 11:30AM.  He requested appt to get meds refill (Xanax). I said "no refills on controlled meds without an office visit". I added that he "no showed" his 6/23 visit.  The only appt I could possible give him was Friday at 4pm, but I was hesitate about making it because of the PM schedule, please review.  I scheduled the appt so no one would take the time slot. If there is another time that is of better fit for you, please let me know. I will call and confirm time with patient. I told patient I will follow up with time/date approved by Dr. Milinda Cave and Moshe Cipro.  Thank you. Annabelle Harman

## 2019-08-25 MED ORDER — ALPRAZOLAM 1 MG PO TABS: ORAL_TABLET | ORAL | 1 refills | Status: DC

## 2019-08-25 NOTE — Telephone Encounter (Signed)
Okay to keep Friday appt at 4pm? Please send to [facility] with approval

## 2019-08-25 NOTE — Telephone Encounter (Signed)
Please follow up with patient.

## 2019-08-25 NOTE — Telephone Encounter (Signed)
Appt r/s for 8/30

## 2019-08-25 NOTE — Telephone Encounter (Signed)
I sent in xanax rx for #30 with 1 RF. Pt needs to arrange f/u in 1 mo. No need to come in this Friday,so he can be taken off the schedule.-thx

## 2019-08-27 ENCOUNTER — Ambulatory Visit: Payer: 59 | Admitting: Family Medicine

## 2019-09-17 ENCOUNTER — Other Ambulatory Visit: Payer: Self-pay

## 2019-09-20 ENCOUNTER — Ambulatory Visit (INDEPENDENT_AMBULATORY_CARE_PROVIDER_SITE_OTHER): Payer: 59 | Admitting: Family Medicine

## 2019-09-20 ENCOUNTER — Encounter: Payer: Self-pay | Admitting: Family Medicine

## 2019-09-20 ENCOUNTER — Other Ambulatory Visit: Payer: Self-pay

## 2019-09-20 VITALS — BP 137/80 | HR 58 | Temp 97.9°F | Resp 16 | Ht 73.0 in | Wt 260.2 lb

## 2019-09-20 DIAGNOSIS — I10 Essential (primary) hypertension: Secondary | ICD-10-CM | POA: Diagnosis not present

## 2019-09-20 DIAGNOSIS — F5104 Psychophysiologic insomnia: Secondary | ICD-10-CM | POA: Diagnosis not present

## 2019-09-20 DIAGNOSIS — F419 Anxiety disorder, unspecified: Secondary | ICD-10-CM | POA: Diagnosis not present

## 2019-09-20 DIAGNOSIS — Z23 Encounter for immunization: Secondary | ICD-10-CM

## 2019-09-20 MED ORDER — LISINOPRIL 10 MG PO TABS: 10.0000 mg | ORAL_TABLET | Freq: Every day | ORAL | 1 refills | Status: DC

## 2019-09-20 MED ORDER — METOPROLOL SUCCINATE ER 25 MG PO TB24: 25.0000 mg | ORAL_TABLET | Freq: Every day | ORAL | 1 refills | Status: DC

## 2019-09-20 NOTE — Addendum Note (Signed)
Addended by: Paschal Dopp on: 09/20/2019 04:26 PM   Modules accepted: Orders
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
## 2019-09-21 ENCOUNTER — Encounter: Payer: Self-pay | Admitting: Family Medicine

## 2019-09-21 ENCOUNTER — Ambulatory Visit (INDEPENDENT_AMBULATORY_CARE_PROVIDER_SITE_OTHER): Payer: 59 | Admitting: Family Medicine

## 2019-09-21 VITALS — BP 134/88 | HR 83 | Temp 98.6°F | Resp 18

## 2019-09-21 DIAGNOSIS — R0789 Other chest pain: Secondary | ICD-10-CM | POA: Diagnosis not present
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
## 2019-09-24 ENCOUNTER — Ambulatory Visit (INDEPENDENT_AMBULATORY_CARE_PROVIDER_SITE_OTHER): Payer: 59 | Admitting: Family Medicine

## 2019-09-24 ENCOUNTER — Telehealth: Payer: Self-pay

## 2019-09-24 ENCOUNTER — Other Ambulatory Visit: Payer: Self-pay

## 2019-09-24 ENCOUNTER — Encounter: Payer: Self-pay | Admitting: Family Medicine

## 2019-09-24 VITALS — BP 122/90 | HR 102 | Temp 97.7°F

## 2019-09-24 DIAGNOSIS — K529 Noninfective gastroenteritis and colitis, unspecified: Secondary | ICD-10-CM

## 2019-09-24 DIAGNOSIS — R112 Nausea with vomiting, unspecified: Secondary | ICD-10-CM | POA: Diagnosis not present

## 2019-09-24 DIAGNOSIS — R109 Unspecified abdominal pain: Secondary | ICD-10-CM | POA: Diagnosis not present

## 2019-09-24 DIAGNOSIS — E860 Dehydration: Secondary | ICD-10-CM

## 2019-09-24 LAB — COMPREHENSIVE METABOLIC PANEL
ALT: 24 U/L (ref 0–53)
AST: 22 U/L (ref 0–37)
Albumin: 5.2 g/dL (ref 3.5–5.2)
Alkaline Phosphatase: 72 U/L (ref 39–117)
BUN: 17 mg/dL (ref 6–23)
CO2: 29 mEq/L (ref 19–32)
Calcium: 9.8 mg/dL (ref 8.4–10.5)
Chloride: 99 mEq/L (ref 96–112)
Creatinine, Ser: 1.3 mg/dL (ref 0.40–1.50)
GFR: 58.07 mL/min — ABNORMAL LOW (ref >60.00)
Glucose, Bld: 101 mg/dL — ABNORMAL HIGH (ref 70–99)
Potassium: 4.2 mEq/L (ref 3.5–5.1)
Sodium: 142 mEq/L (ref 135–145)
Total Bilirubin: 0.4 mg/dL (ref 0.2–1.2)
Total Protein: 8.1 g/dL (ref 6.0–8.3)

## 2019-09-24 LAB — CBC WITH DIFFERENTIAL/PLATELET
Basophils Absolute: 0 10*3/uL (ref 0.0–0.1)
Basophils Relative: 0.6 % (ref 0.0–3.0)
Eosinophils Absolute: 0.1 10*3/uL (ref 0.0–0.7)
Eosinophils Relative: 0.9 % (ref 0.0–5.0)
HCT: 48.5 % (ref 39.0–52.0)
Hemoglobin: 16 g/dL (ref 13.0–17.0)
Lymphocytes Relative: 24.4 % (ref 12.0–46.0)
Lymphs Abs: 1.7 10*3/uL (ref 0.7–4.0)
MCHC: 33.1 g/dL (ref 30.0–36.0)
MCV: 93.2 fl (ref 78.0–100.0)
Monocytes Absolute: 0.8 10*3/uL (ref 0.1–1.0)
Monocytes Relative: 11.3 % (ref 3.0–12.0)
Neutro Abs: 4.5 10*3/uL (ref 1.4–7.7)
Neutrophils Relative %: 62.8 % (ref 43.0–77.0)
Platelets: 346 10*3/uL (ref 150.0–400.0)
RBC: 5.21 Mil/uL (ref 4.22–5.81)
RDW: 13.8 % (ref 11.5–15.5)
WBC: 7.1 10*3/uL (ref 4.0–10.5)

## 2019-09-24 LAB — C-REACTIVE PROTEIN: CRP: 4.9 mg/dL (ref 0.5–20.0)

## 2019-09-24 MED ORDER — PROMETHAZINE HCL 25 MG PO TABS: 25.0000 mg | ORAL_TABLET | Freq: Three times a day (TID) | ORAL | 0 refills | Status: AC | PRN

## 2019-09-24 MED ORDER — PROMETHAZINE HCL 25 MG RE SUPP: 25.0000 mg | Freq: Four times a day (QID) | RECTAL | 0 refills | Status: AC | PRN

## 2019-09-24 MED ORDER — SUCRALFATE 1 G PO TABS: 2.0000 g | ORAL_TABLET | Freq: Three times a day (TID) | ORAL | 0 refills | Status: AC

## 2019-09-24 MED ORDER — PROMETHAZINE HCL 25 MG/ML IJ SOLN
25.0000 mg | Freq: Once | INTRAMUSCULAR | Status: AC
  Administered 2019-09-24: 25 mg via INTRAMUSCULAR
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
## 2019-09-24 NOTE — Addendum Note (Signed)
Addended by: Jeoffrey Massed on: 09/24/2019 10:07 AM   Modules accepted: Level of Service

## 2019-09-24 NOTE — Telephone Encounter (Signed)
Patient was seen today and advised to f/u in 4 days on 9/7. PCP gave ok to add on at 11:45 for Tuesday, 9/7. If patient returns call, please assist with scheduling.

## 2019-09-24 NOTE — Patient Instructions (Signed)
Stop taking pepto bismol. Take over the counter generic imodium, 1 adult dose twice per day if having 3 BMs per day or more.

## 2019-09-29 ENCOUNTER — Ambulatory Visit: Payer: 59

## 2019-09-29 ENCOUNTER — Other Ambulatory Visit: Payer: Self-pay

## 2019-09-29 DIAGNOSIS — I10 Essential (primary) hypertension: Secondary | ICD-10-CM

## 2019-10-18 ENCOUNTER — Other Ambulatory Visit: Payer: Self-pay | Admitting: Family Medicine

## 2019-10-19 ENCOUNTER — Other Ambulatory Visit: Payer: Self-pay

## 2019-10-19 NOTE — Telephone Encounter (Signed)
I'm denying this RF---too early. This rule is consistent with the controlled substance contract he signed (no early RFs).  I'll see him on the 30th and we'll discuss med at that time.-thx

## 2019-10-19 NOTE — Telephone Encounter (Signed)
Requesting: alprazolam Contract: 8/3/0/21 UDS: n/a Last Visit:09/24/19 Next Visit:10/21/19 Last Refill:08/25/19(30,1)  Please Advise. Medication pending

## 2019-10-21 ENCOUNTER — Ambulatory Visit: Payer: 59 | Admitting: Family Medicine

## 2019-10-21 DIAGNOSIS — Z0289 Encounter for other administrative examinations: Secondary | ICD-10-CM
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
## 2019-11-18 ENCOUNTER — Other Ambulatory Visit: Payer: Self-pay | Admitting: Family Medicine

## 2019-11-19 ENCOUNTER — Other Ambulatory Visit: Payer: Self-pay | Admitting: Family Medicine

## 2019-11-19 NOTE — Telephone Encounter (Signed)
Left message for pt to call back  °

## 2019-11-19 NOTE — Telephone Encounter (Signed)
OK I rx'd alpraz, #30, rf x 2. He needs approp f/u for this med/anxiety/insomnia in 3 mo.

## 2019-11-19 NOTE — Telephone Encounter (Signed)
Requesting:alprazolam Contract: 09/20/19 UDS:n/a Last Visit:09/24/19 Next Visit:10/21/19, no show Last Refill:08/25/19 (30,1)  Please Advise. Medication pending

## 2019-11-22 ENCOUNTER — Ambulatory Visit: Payer: 59

## 2020-04-21 ENCOUNTER — Other Ambulatory Visit: Payer: Self-pay | Admitting: Family Medicine

## 2020-04-21 NOTE — Telephone Encounter (Signed)
Requesting: alprazolam Contract: 09/20/19 UDS: n/a Last Visit: 09/24/19, acute and RCI 09/20/19 Next Visit: advised to f/u 1 mo. HTN, 7-10d labs Last Refill: 11/19/19(30,2)  Please Advise. Medication pending

## 2020-04-23 NOTE — Telephone Encounter (Signed)
I did 30d supply, no RF. Needs f/u in office for this problem before any FURTHER RFs.

## 2020-04-24 NOTE — Telephone Encounter (Signed)
Pt informed me that he est care with new PCP 03/31/20

## 2020-05-13 IMAGING — MR MR KNEE*L* W/O CM
7 series · 40 of 40 positions shown · non-contrast
Comparison: Radiographs from 01/15/2018

CLINICAL DATA: Medial left knee pain for 3 years. Reduced flexion
with crepitus.

EXAM:
MRI OF THE LEFT KNEE WITHOUT CONTRAST
TECHNIQUE: Multiplanar, multisequence MR imaging of the knee was performed. No
intravenous contrast was administered.

[Series 3: T2 fat-sat · axial · 4.0mm · 0.62mm/px · z∈[-96,+24]mm · 5 of 25 slices shown (1 of 3)]
[im 1/25]
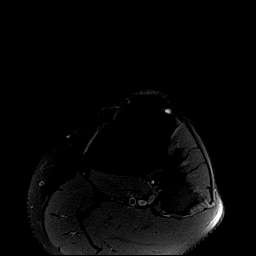
[im 7/25]
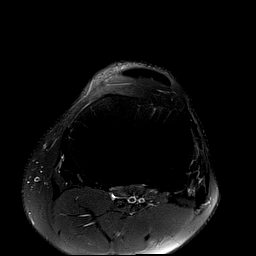
[im 13/25]
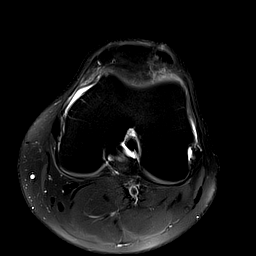
[im 19/25]
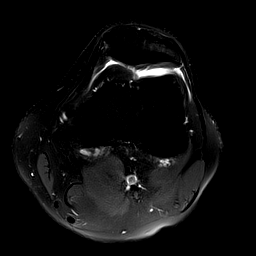
[im 25/25]
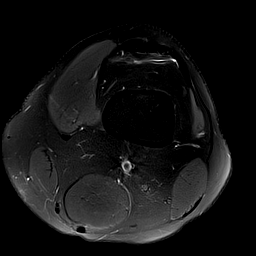

[Series 4: T1 · coronal · 4.0mm · 0.47mm/px · 5 of 25 slices shown]
[im 1/25]
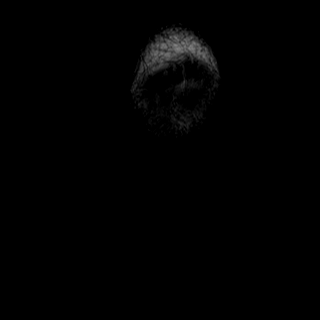
[im 7/25]
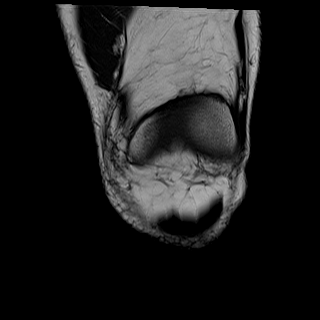
[im 13/25]
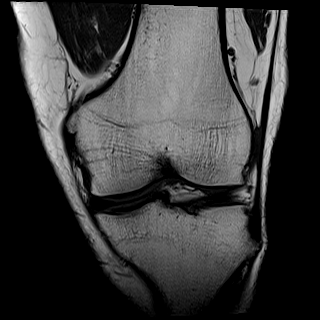
[im 19/25]
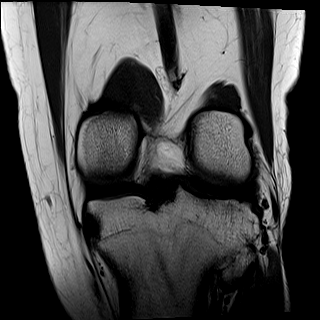
[im 25/25]
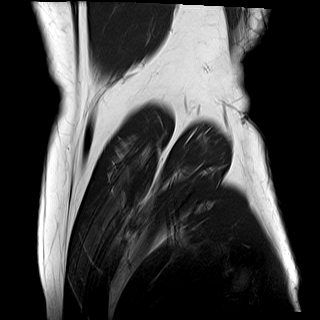

[Series 5: T2 fat-sat · coronal · 4.0mm · 0.59mm/px · 5 of 25 slices shown (2 of 3)]
[im 1/25]
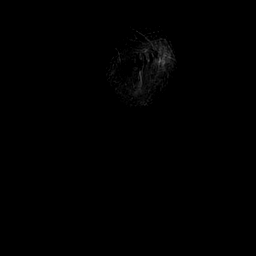
[im 7/25]
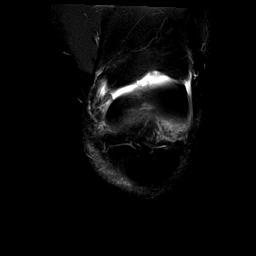
[im 13/25]
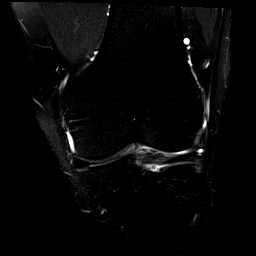
[im 19/25]
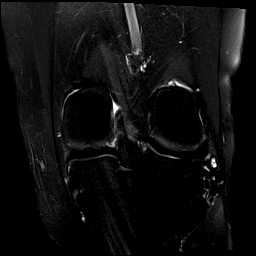
[im 25/25]
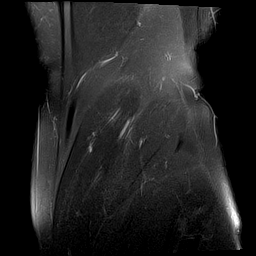

[Series 6: PD fat-sat · coronal · 3.0mm · 0.59mm/px · 7 of 31 slices shown (1 of 2)]
[im 1/31]
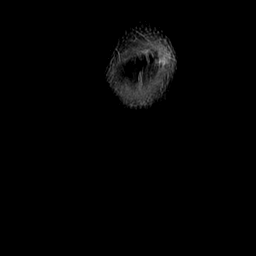
[im 6/31]
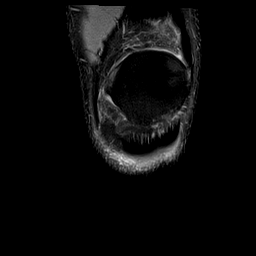
[im 11/31]
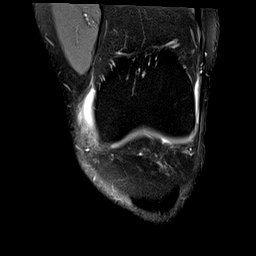
[im 16/31]
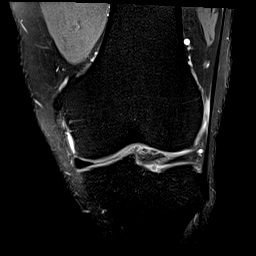
[im 21/31]
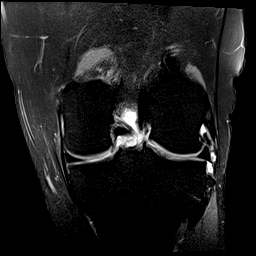
[im 26/31]
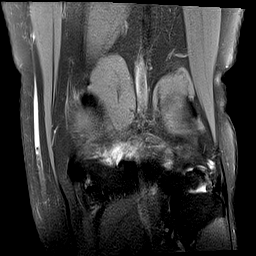
[im 31/31]
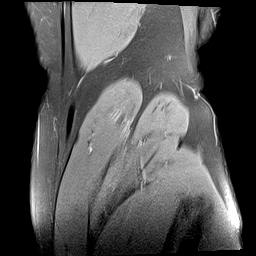

[Series 7: PD fat-sat · sagittal · 3.0mm · 0.59mm/px · 7 of 30 slices shown (2 of 2)]
[im 1/30]
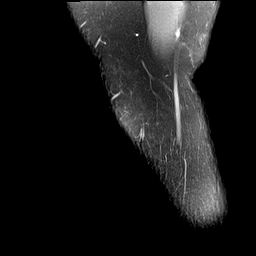
[im 5/30]
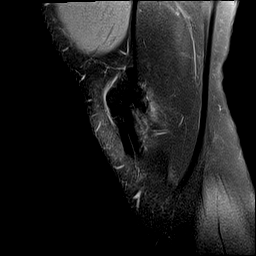
[im 10/30]
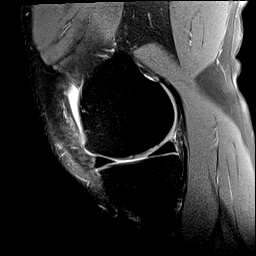
[im 15/30]
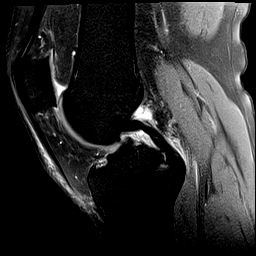
[im 20/30]
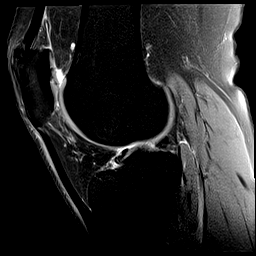
[im 25/30]
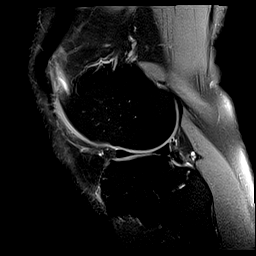
[im 30/30]
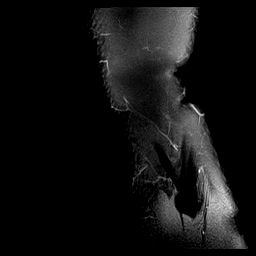

[Series 8: T2 fat-sat · sagittal · 3.0mm · 0.59mm/px · 7 of 30 slices shown (3 of 3)]
[im 1/30]
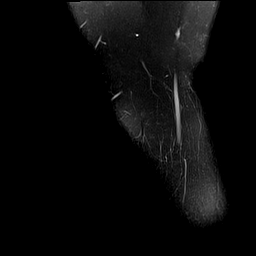
[im 5/30]
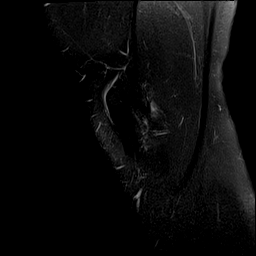
[im 10/30]
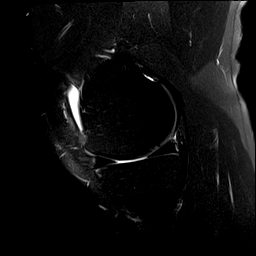
[im 15/30]
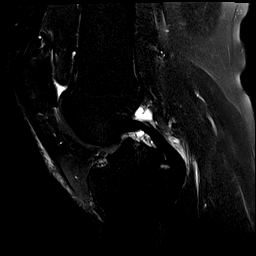
[im 20/30]
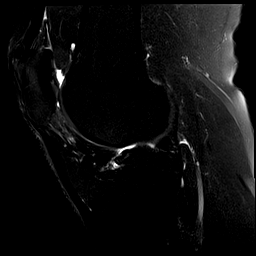
[im 25/30]
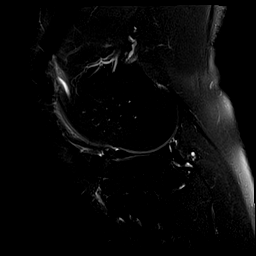
[im 30/30]
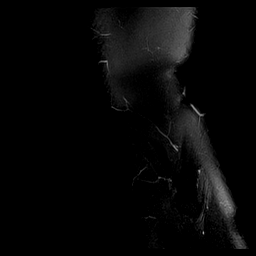

[Series 9: PD · coronal · 2.0mm · 0.47mm/px · 4 of 18 slices shown]
[im 1/18]
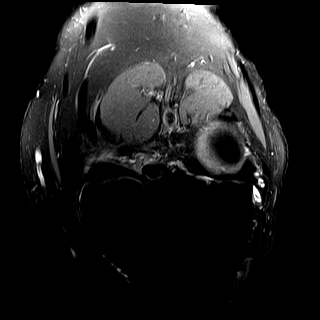
[im 6/18]
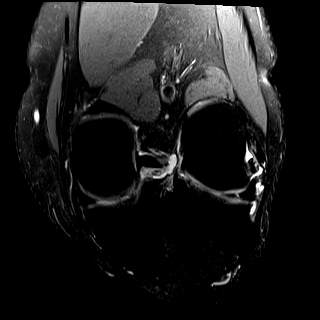
[im 12/18]
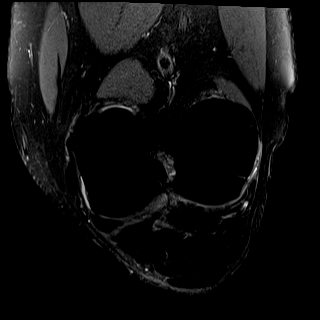
[im 18/18]
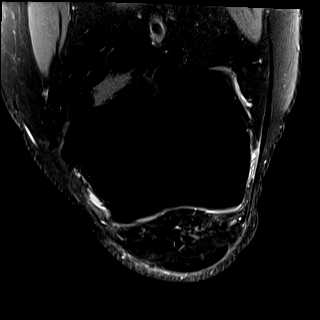

[40 of 40 positions shown; findings below may reference images not displayed]

FINDINGS: MENISCI

Medial meniscus:  Unremarkable

Lateral meniscus: Small free edge tear of the anterior horn adjacent
to the midbody on image [DATE]. Poor definition of the root of the
posterior horn although a well-defined tear of the root of the
posterior horn is not seen.

LIGAMENTS

Cruciates:  Unremarkable

Collaterals: There is notable ossification at the proximal
attachment site of the MCL, with slight thickening of the adjacent
proximal MCL but no surrounding edema.

CARTILAGE

Patellofemoral: Irregular multifocal patellar fissuring along the
posterior patellar ridge and adjacent lateral facet on image [DATE] at
the mid patellar level. Moderate chondral thinning along the medial
patellar facet. Mild marginal spurring. Femoral trochlear groove
cartilage is intact.

Medial: 6 mm partial-thickness chondral defect laterally along the
medial tibial plateau on image [DATE].

Lateral:  Unremarkable

Joint:  Small knee effusion.

Popliteal Fossa:  Distal semimembranosus tendinopathy.

Extensor Mechanism:  Mild distal quadriceps tendinopathy.

Bones:  Spurring along the proximal tibiofibular articulation.

Other: No supplemental non-categorized findings.
IMPRESSION: 1. Small free edge tear of the anterior horn lateral meniscus
adjacent to the midbody.
2. Multifocal patellar fissuring centrally along the posterior
patellar ridge. Moderate chondral thinning along the medial patellar
facet.
3. Small partial-thickness chondral defect laterally along the
medial tibial plateau.
4. Distal semimembranosus tendinopathy right at the insertion.
5. Small knee effusion.
6. Mild distal quadriceps tendinopathy.
7. Mild Pellegrini-Stieda disease with adjacent mild thickening of
the MCL but no surrounding edema.

## 2020-08-23 ENCOUNTER — Other Ambulatory Visit: Payer: Self-pay | Admitting: Family Medicine
